# Patient Record
Sex: Female | Born: 1990 | Race: Black or African American | Hispanic: No | Marital: Single | Smoking: Current some day smoker
Health system: Southern US, Community
[De-identification: ages and names within clinical notes are randomized; demographics above are authoritative.]

## PROBLEM LIST (undated history)

## (undated) DIAGNOSIS — E2839 Other primary ovarian failure: Secondary | ICD-10-CM

## (undated) DIAGNOSIS — K219 Gastro-esophageal reflux disease without esophagitis: Secondary | ICD-10-CM

## (undated) HISTORY — DX: Other primary ovarian failure: E28.39

---

## 2011-05-17 ENCOUNTER — Emergency Department (HOSPITAL_COMMUNITY)
Admission: EM | Admit: 2011-05-17 | Discharge: 2011-05-17 | Disposition: A | Discharge status: Home / Self Care | Payer: BC Managed Care – PPO | Attending: Emergency Medicine | Admitting: Emergency Medicine

## 2011-05-17 ENCOUNTER — Emergency Department (HOSPITAL_COMMUNITY): Payer: BC Managed Care – PPO

## 2011-05-17 DIAGNOSIS — M79609 Pain in unspecified limb: Secondary | ICD-10-CM | POA: Insufficient documentation

## 2011-07-10 ENCOUNTER — Encounter: Payer: Self-pay | Admitting: *Deleted

## 2011-07-10 ENCOUNTER — Emergency Department (HOSPITAL_COMMUNITY)
Admission: EM | Admit: 2011-07-10 | Discharge: 2011-07-10 | Discharge status: Against Medical Advice | Payer: BC Managed Care – PPO | Attending: Emergency Medicine | Admitting: Emergency Medicine

## 2011-07-10 DIAGNOSIS — K6289 Other specified diseases of anus and rectum: Secondary | ICD-10-CM | POA: Insufficient documentation

## 2011-07-10 NOTE — ED Notes (Signed)
Called pt x2 , no answer.

## 2011-07-10 NOTE — ED Notes (Signed)
Pt here with rectal pain that started yesterday and no bleeding

## 2012-11-11 ENCOUNTER — Encounter (HOSPITAL_COMMUNITY): Payer: Self-pay | Admitting: Nurse Practitioner

## 2012-11-11 ENCOUNTER — Emergency Department (HOSPITAL_COMMUNITY)
Admission: EM | Admit: 2012-11-11 | Discharge: 2012-11-11 | Disposition: A | Discharge status: Home / Self Care | Payer: BC Managed Care – PPO | Attending: Emergency Medicine | Admitting: Emergency Medicine

## 2012-11-11 ENCOUNTER — Emergency Department (HOSPITAL_COMMUNITY): Payer: BC Managed Care – PPO

## 2012-11-11 DIAGNOSIS — M25549 Pain in joints of unspecified hand: Secondary | ICD-10-CM | POA: Insufficient documentation

## 2012-11-11 DIAGNOSIS — M25542 Pain in joints of left hand: Secondary | ICD-10-CM

## 2012-11-11 DIAGNOSIS — M25449 Effusion, unspecified hand: Secondary | ICD-10-CM | POA: Insufficient documentation

## 2012-11-11 MED ORDER — NAPROXEN 500 MG PO TABS: 500.0000 mg | ORAL_TABLET | Freq: Two times a day (BID) | ORAL | Status: DC

## 2012-11-11 MED ORDER — HYDROCODONE-ACETAMINOPHEN 5-325 MG PO TABS
2.0000 | ORAL_TABLET | Freq: Once | ORAL | Status: AC
  Administered 2012-11-11: 1 via ORAL
  Filled 2012-11-11: qty 1

## 2012-11-11 NOTE — Progress Notes (Signed)
Orthopedic Tech Progress Note Patient Details:  Jody Clark [birth year] [id number]  Ortho Devices Type of Ortho Device: Finger splint Ortho Device/Splint Location: left hand Ortho Device/Splint Interventions: Application   Nikki Dom 11/11/2012, 5:56 PM

## 2012-11-11 NOTE — ED Provider Notes (Signed)
History  This chart was scribed for non-physician practitioner working with Dierdre Forth, PA-C/Brian Alanda Amass, MD, by Candelaria Stagers, ED Scribe. This patient was seen in room TR04C/TR04C and the patient's care was started at 5:19 PM   CSN: [id number]  Arrival date & time 11/11/12  1404   None     Chief Complaint  Patient presents with  . Hand Pain     The history is provided by the patient and medical records. No language interpreter was used.   Jody Clark is a 22 y.o. female who presents to the Emergency Department complaining of left 5th digit pain that started this morning when she woke up.  Pt denies any injury or trauma to the hand.  She denies any new activities.  She has taken nothing for the pain.  Pt denies weakness to the hand.  She has no h/o injury to this hand.  Movement makes the pain worse, rest makes the pain better.  She rates the pain as moderate and nonradiating.  History reviewed. No pertinent past medical history.  History reviewed. No pertinent past surgical history.  History reviewed. No pertinent family history.  History  Substance Use Topics  . Smoking status: Never Smoker   . Smokeless tobacco: Not on file  . Alcohol Use: No    OB History   Grav Para Term Preterm Abortions TAB SAB Ect Mult Living                  Review of Systems  Constitutional: Negative for fever and chills.  Gastrointestinal: Negative for nausea and vomiting.  Musculoskeletal: Positive for arthralgias (pain to left pinky).  All other systems reviewed and are negative.    Allergies  Review of patient's allergies indicates no known allergies.  Home Medications   Current Outpatient Rx  Name  Route  Sig  Dispense  Refill  . naproxen (NAPROSYN) 500 MG tablet   Oral   Take 1 tablet (500 mg total) by mouth 2 (two) times daily with a meal.   30 tablet   0     BP 113/74  Pulse 85  Temp(Src) 98.4 F (36.9 C) (Oral)  Resp 15  SpO2 98%  LMP  10/24/2012  Physical Exam  Nursing note and vitals reviewed. Constitutional: She is oriented to person, place, and time. She appears well-developed and well-nourished. No distress.  HENT:  Head: Normocephalic and atraumatic.  Mouth/Throat: Oropharynx is clear and moist. No oropharyngeal exudate.  Eyes: Conjunctivae are normal. No scleral icterus.  Neck: Normal range of motion. Neck supple.  Cardiovascular: Normal rate, regular rhythm and intact distal pulses.   Pulses:      Radial pulses are 2+ on the right side, and 2+ on the left side.  Claire refill less than 3 seconds  Pulmonary/Chest: Effort normal and breath sounds normal. No respiratory distress. She has no wheezes.  Abdominal: Soft. Bowel sounds are normal. She exhibits no mass. There is no tenderness. There is no rebound and no guarding.  Musculoskeletal:       Left hand: She exhibits decreased range of motion, tenderness, bony tenderness and swelling. She exhibits normal two-point discrimination, normal capillary refill, no deformity and no laceration. Normal sensation noted. Normal strength noted.  Full ROM of the DIP and PIP of left 5th digit.  Mildly decreased grip strength secondary to pain.  Mild swelling of the MCP joint of the left 5th digit.  Full passive ROM.  Cap refill less than 3 seconds.  Neurological: She is alert and oriented to person, place, and time. She exhibits normal muscle tone. Coordination normal. GCS eye subscore is 4. GCS verbal subscore is 5. GCS motor subscore is 6.  Reflex Scores:      Tricep reflexes are 2+ on the right side and 2+ on the left side.      Bicep reflexes are 2+ on the right side and 2+ on the left side.      Brachioradialis reflexes are 2+ on the right side and 2+ on the left side. Speech is clear and goal oriented Moves extremities without ataxia Patient intact including 2 point discrimination  Skin: Skin is warm and dry. No rash noted. She is not diaphoretic. No erythema.   Psychiatric: She has a normal mood and affect.    ED Course  Procedures   DIAGNOSTIC STUDIES: Oxygen Saturation is 98% on room air, normal by my interpretation.    COORDINATION OF CARE:  5:37 PM Discussed course of care with pt which includes splinting the finger, antiinflammatory, and referral to hand specialist if pain does not improve in three days.  Pt understands and agrees.    Labs Reviewed - No data to display Dg Finger Little Left  11/11/2012  *RADIOLOGY REPORT*  Clinical Data: Left little finger pain and unable to move.  LEFT LITTLE FINGER 2+V  Comparison: None  Findings: No evidence of acute fracture, subluxation or dislocation identified.  No radio-opaque foreign bodies are present.  No focal bony lesions are noted.  The joint spaces are unremarkable.  IMPRESSION: No bony abnormality.   Original Report Authenticated By: Harmon Pier, M.D.      1. Pain, joint, hand, left       MDM  Feliberto Gottron resents with nontraumatic left fifth digit MCP pain.  Patient X-Ray negative for obvious fracture or dislocation; no evidence of arthritis in his joints spaces are maintained. Pain managed in ED. Pt advised to follow up with hand specialty orthopedics if symptoms persist for possibility of missed fracture or other joint condition diagnosis.   Finger splinted while in ED, conservative therapy recommended and discussed. Patient will be dc home & is agreeable with above plan. I personally reviewed the imaging tests through PACS system.  I reviewed available ER/hospitalization records through the EMR. I have also discussed reasons to return immediately to the ER.  Patient expresses understanding and agrees with plan.  I personally performed the services described in this documentation, which was scribed in my presence. The recorded information has been reviewed and is accurate.   Dahlia Client Erman Thum, PA-C 11/11/12 1751

## 2012-11-11 NOTE — ED Notes (Signed)
signiture pad not working in room, pt voiced understanding of d/c information

## 2012-11-11 NOTE — ED Notes (Signed)
Pt states she woke this morning, unable to move her left pinky finger.  Denies injury.  No numbess, no pain elsewhere.

## 2012-11-11 NOTE — ED Provider Notes (Signed)
Medical screening examination/treatment/procedure(s) were performed by non-physician practitioner and as supervising physician I was immediately available for consultation/collaboration.    Zoeann Mol D Tangala Wiegert, MD 11/11/12 2342 

## 2012-11-11 NOTE — ED Notes (Signed)
Pt c/o L 5th digit pain for past days, no know injuries, cms intact

## 2013-05-04 ENCOUNTER — Emergency Department (HOSPITAL_COMMUNITY)
Admission: EM | Admit: 2013-05-04 | Discharge: 2013-05-04 | Disposition: A | Discharge status: Home / Self Care | Payer: BC Managed Care – PPO | Attending: Emergency Medicine | Admitting: Emergency Medicine

## 2013-05-04 ENCOUNTER — Encounter (HOSPITAL_COMMUNITY): Payer: Self-pay | Admitting: Emergency Medicine

## 2013-05-04 DIAGNOSIS — K921 Melena: Secondary | ICD-10-CM | POA: Insufficient documentation

## 2013-05-04 DIAGNOSIS — K644 Residual hemorrhoidal skin tags: Secondary | ICD-10-CM | POA: Insufficient documentation

## 2013-05-04 MED ORDER — HYDROCORTISONE 2.5 % RE CREA: TOPICAL_CREAM | RECTAL | Status: DC

## 2013-05-04 NOTE — ED Notes (Signed)
Had a hard bm this am and now she thinks hemorroid  Has dropped hurts now had a spot of blood

## 2013-05-04 NOTE — ED Provider Notes (Signed)
CSN: [id number]     Arrival date & time 05/04/13  1117 History   First MD Initiated Contact with Patient 05/04/13 1245     Chief Complaint  Patient presents with  . Rectal Pain   (Consider location/radiation/quality/duration/timing/severity/associated sxs/prior Treatment) Patient is a 22 y.o. female presenting with hematochezia. The history is provided by the patient. No language interpreter was used.  Rectal Bleeding Associated symptoms: no abdominal pain and no fever   Associated symptoms comment:  She passed a large, hard stool this morning and experienced rectal pain and BRB with stool only. No bleeding since. She has had persistent pain.   No past medical history on file. History reviewed. No pertinent past surgical history. No family history on file. History  Substance Use Topics  . Smoking status: Never Smoker   . Smokeless tobacco: Not on file  . Alcohol Use: No   OB History   Grav Para Term Preterm Abortions TAB SAB Ect Mult Living                 Review of Systems  Constitutional: Negative for fever.  Gastrointestinal: Positive for blood in stool, hematochezia and rectal pain. Negative for abdominal pain.    Allergies  Review of patient's allergies indicates no known allergies.  Home Medications  No current outpatient prescriptions on file. BP 110/60  Pulse 67  Temp(Src) 98.6 F (37 C) (Oral)  Resp 16  Wt 110 lb (49.896 kg)  SpO2 99% Physical Exam  Constitutional: She is oriented to person, place, and time. She appears well-developed and well-nourished. No distress.  Pulmonary/Chest: Effort normal.  Genitourinary:  Large, non-thrombosed external hemorrhoid. No active bleeding.  Neurological: She is alert and oriented to person, place, and time.    ED Course  Procedures (including critical care time) Labs Review Labs Reviewed - No data to display Imaging Review No results found.  MDM  No diagnosis found. 1. External hemorrhoid  Uncomplicated  external hemorrhoid    Arnoldo Hooker, PA-C 05/04/13 1336

## 2013-05-04 NOTE — ED Provider Notes (Signed)
Medical screening examination/treatment/procedure(s) were performed by non-physician practitioner and as supervising physician I was immediately available for consultation/collaboration.   William Adabelle Griffiths, MD 05/04/13 1650 

## 2013-05-14 ENCOUNTER — Other Ambulatory Visit (HOSPITAL_COMMUNITY)
Admission: RE | Admit: 2013-05-14 | Discharge: 2013-05-14 | Disposition: A | Discharge status: Home / Self Care | Payer: BC Managed Care – PPO | Source: Ambulatory Visit | Attending: Family Medicine | Admitting: Family Medicine

## 2013-05-14 ENCOUNTER — Emergency Department (INDEPENDENT_AMBULATORY_CARE_PROVIDER_SITE_OTHER)
Admission: EM | Admit: 2013-05-14 | Discharge: 2013-05-14 | Disposition: A | Discharge status: Home / Self Care | Payer: BC Managed Care – PPO | Source: Home / Self Care | Attending: Family Medicine | Admitting: Family Medicine

## 2013-05-14 ENCOUNTER — Encounter (HOSPITAL_COMMUNITY): Payer: Self-pay | Admitting: Emergency Medicine

## 2013-05-14 DIAGNOSIS — N760 Acute vaginitis: Secondary | ICD-10-CM

## 2013-05-14 DIAGNOSIS — B9689 Other specified bacterial agents as the cause of diseases classified elsewhere: Secondary | ICD-10-CM

## 2013-05-14 DIAGNOSIS — A499 Bacterial infection, unspecified: Secondary | ICD-10-CM

## 2013-05-14 DIAGNOSIS — Z113 Encounter for screening for infections with a predominantly sexual mode of transmission: Secondary | ICD-10-CM | POA: Insufficient documentation

## 2013-05-14 DIAGNOSIS — N390 Urinary tract infection, site not specified: Secondary | ICD-10-CM

## 2013-05-14 LAB — POCT URINALYSIS DIP (DEVICE)
Glucose, UA: NEGATIVE mg/dL
Nitrite: NEGATIVE
Urobilinogen, UA: 0.2 mg/dL (ref 0.0–1.0)

## 2013-05-14 LAB — POCT PREGNANCY, URINE: Preg Test, Ur: NEGATIVE

## 2013-05-14 MED ORDER — METRONIDAZOLE 500 MG PO TABS: 500.0000 mg | ORAL_TABLET | Freq: Two times a day (BID) | ORAL | Status: DC

## 2013-05-14 MED ORDER — CEPHALEXIN 500 MG PO CAPS: 1000.0000 mg | ORAL_CAPSULE | Freq: Two times a day (BID) | ORAL | Status: DC

## 2013-05-14 NOTE — ED Provider Notes (Signed)
CSN: [id number]     Arrival date & time 05/14/13  1646 History   First MD Initiated Contact with Patient 05/14/13 1725     Chief Complaint  Patient presents with  . Vaginal Itching    x 3 days.    (Consider location/radiation/quality/duration/timing/severity/associated sxs/prior Treatment) HPI Comments: 22 year old female presents complaining of possible yeast infection, also she has never had one before. She has vaginal irritation, cloudy urine, and a slight discharge. This has been going on for about 3 days. She denies fever, chills, abdominal pain, flank pain. She denies possibility of STD, stating she is not sexually active.  Patient is a 22 y.o. female presenting with vaginal itching.  Vaginal Itching Pertinent negatives include no chest pain, no abdominal pain and no shortness of breath.    History reviewed. No pertinent past medical history. History reviewed. No pertinent past surgical history. History reviewed. No pertinent family history. History  Substance Use Topics  . Smoking status: Never Smoker   . Smokeless tobacco: Not on file  . Alcohol Use: Yes   OB History   Grav Para Term Preterm Abortions TAB SAB Ect Mult Living                 Review of Systems  Constitutional: Negative for fever and chills.  Eyes: Negative for visual disturbance.  Respiratory: Negative for cough and shortness of breath.   Cardiovascular: Negative for chest pain, palpitations and leg swelling.  Gastrointestinal: Negative for nausea, vomiting and abdominal pain.  Endocrine: Negative for polydipsia and polyuria.  Genitourinary: Positive for dysuria and vaginal discharge. Negative for urgency, frequency, hematuria, flank pain, vaginal bleeding, genital sores and vaginal pain.  Musculoskeletal: Negative for myalgias and arthralgias.  Skin: Negative for rash.  Neurological: Negative for dizziness, weakness and light-headedness.    Allergies  Review of patient's allergies indicates no known  allergies.  Home Medications   Current Outpatient Rx  Name  Route  Sig  Dispense  Refill  . cephALEXin (KEFLEX) 500 MG capsule   Oral   Take 2 capsules (1,000 mg total) by mouth 2 (two) times daily.   28 capsule   0   . hydrocortisone (ANUSOL-HC) 2.5 % rectal cream      Apply rectally 2 times daily   30 g   0   . metroNIDAZOLE (FLAGYL) 500 MG tablet   Oral   Take 1 tablet (500 mg total) by mouth 2 (two) times daily.   14 tablet   0    BP 124/84  Pulse 84  Temp(Src) 98.6 F (37 C) (Oral)  Resp 20  SpO2 100%  LMP 04/23/2013 Physical Exam  Nursing note and vitals reviewed. Constitutional: She is oriented to person, place, and time. Vital signs are normal. She appears well-developed and well-nourished. No distress.  HENT:  Head: Normocephalic and atraumatic.  Pulmonary/Chest: Effort normal. No respiratory distress.  Genitourinary: Vaginal discharge (Thin, yellow, malodorous) found.  Neurological: She is alert and oriented to person, place, and time. She has normal strength. Coordination normal.  Skin: Skin is warm and dry. No rash noted. She is not diaphoretic.  Psychiatric: She has a normal mood and affect. Judgment normal.    ED Course  Procedures (including critical care time) Labs Review Labs Reviewed  POCT URINALYSIS DIP (DEVICE) - Abnormal; Notable for the following:    Hgb urine dipstick MODERATE (*)    Protein, ur 30 (*)    Leukocytes, UA LARGE (*)    All other components within  normal limits  URINE CULTURE  POCT PREGNANCY, URINE  CERVICOVAGINAL ANCILLARY ONLY   Imaging Review No results found.  MDM   1. UTI (lower urinary tract infection)   2. BV (bacterial vaginosis)    Treating for both UTI and for bacterial vaginosis. Vaginal swabs sent. Followup if not improving.   Meds ordered this encounter  Medications  . metroNIDAZOLE (FLAGYL) 500 MG tablet    Sig: Take 1 tablet (500 mg total) by mouth 2 (two) times daily.    Dispense:  14 tablet     Refill:  0  . cephALEXin (KEFLEX) 500 MG capsule    Sig: Take 2 capsules (1,000 mg total) by mouth 2 (two) times daily.    Dispense:  28 capsule    Refill:  0       Graylon Good, PA-C 05/14/13 1823

## 2013-05-14 NOTE — ED Notes (Signed)
C/o vaginal irritation with white discharge and mild odor x 3 days. Pt has not tried any otc meds for treatment.  Denies pelvic/abdominal pain or urinary symptoms.

## 2013-05-14 NOTE — ED Provider Notes (Signed)
Medical screening examination/treatment/procedure(s) were performed by resident physician or non-physician practitioner and as supervising physician I was immediately available for consultation/collaboration.   KINDL,JAMES DOUGLAS MD.   James D Kindl, MD 05/14/13 1917 

## 2013-05-16 LAB — URINE CULTURE

## 2013-05-16 NOTE — ED Notes (Signed)
GC/Chlamydia neg., Affirm: Candida, Trich and Gardnerella all neg.  Urine culture:60,000 colonies Group B Strep (s. Agalactiae).  Pt. adequately treated with Keflex. Vassie Moselle 05/16/2013

## 2015-06-11 ENCOUNTER — Emergency Department (HOSPITAL_COMMUNITY): Payer: BLUE CROSS/BLUE SHIELD

## 2015-06-11 ENCOUNTER — Encounter (HOSPITAL_COMMUNITY): Payer: Self-pay | Admitting: Emergency Medicine

## 2015-06-11 ENCOUNTER — Emergency Department (HOSPITAL_COMMUNITY)
Admission: EM | Admit: 2015-06-11 | Discharge: 2015-06-12 | Disposition: A | Discharge status: Home / Self Care | Payer: BLUE CROSS/BLUE SHIELD | Attending: Emergency Medicine | Admitting: Emergency Medicine

## 2015-06-11 DIAGNOSIS — K219 Gastro-esophageal reflux disease without esophagitis: Secondary | ICD-10-CM | POA: Diagnosis not present

## 2015-06-11 DIAGNOSIS — Z792 Long term (current) use of antibiotics: Secondary | ICD-10-CM | POA: Insufficient documentation

## 2015-06-11 DIAGNOSIS — R1013 Epigastric pain: Secondary | ICD-10-CM

## 2015-06-11 DIAGNOSIS — R079 Chest pain, unspecified: Secondary | ICD-10-CM

## 2015-06-11 HISTORY — DX: Gastro-esophageal reflux disease without esophagitis: K21.9

## 2015-06-11 LAB — CBC
HCT: 41.1 % (ref 36.0–46.0)
HEMOGLOBIN: 14.6 g/dL (ref 12.0–15.0)
MCH: 30 pg (ref 26.0–34.0)
MCHC: 35.5 g/dL (ref 30.0–36.0)
MCV: 84.6 fL (ref 78.0–100.0)
PLATELETS: 290 10*3/uL (ref 150–400)
RBC: 4.86 MIL/uL (ref 3.87–5.11)
RDW: 12.3 % (ref 11.5–15.5)
WBC: 5.7 10*3/uL (ref 4.0–10.5)

## 2015-06-11 LAB — BASIC METABOLIC PANEL
ANION GAP: 8 (ref 5–15)
BUN: 5 mg/dL — ABNORMAL LOW (ref 6–20)
CHLORIDE: 99 mmol/L — AB (ref 101–111)
CO2: 28 mmol/L (ref 22–32)
CREATININE: 0.92 mg/dL (ref 0.44–1.00)
Calcium: 9.5 mg/dL (ref 8.9–10.3)
GFR calc non Af Amer: >60 mL/min (ref >60)
Glucose, Bld: 100 mg/dL — ABNORMAL HIGH (ref 65–99)
POTASSIUM: 4 mmol/L (ref 3.5–5.1)
SODIUM: 135 mmol/L (ref 135–145)

## 2015-06-11 LAB — I-STAT TROPONIN, ED: Troponin i, poc: 0 ng/mL (ref 0.00–0.08)

## 2015-06-11 MED ORDER — OMEPRAZOLE 20 MG PO CPDR: 20.0000 mg | DELAYED_RELEASE_CAPSULE | Freq: Every day | ORAL | Status: DC

## 2015-06-11 MED ORDER — GI COCKTAIL ~~LOC~~
30.0000 mL | Freq: Once | ORAL | Status: AC
  Administered 2015-06-11: 30 mL via ORAL
  Filled 2015-06-11: qty 30

## 2015-06-11 MED ORDER — PANTOPRAZOLE SODIUM 40 MG PO TBEC
40.0000 mg | DELAYED_RELEASE_TABLET | Freq: Every day | ORAL | Status: DC
  Administered 2015-06-11: 40 mg via ORAL
  Filled 2015-06-11: qty 1

## 2015-06-11 NOTE — ED Notes (Signed)
PA at bedside.

## 2015-06-11 NOTE — ED Provider Notes (Signed)
CSN: [id number]     Arrival date & time 06/11/15  1958 History   First MD Initiated Contact with Patient 06/11/15 2231     Chief Complaint  Patient presents with  . Chest Pain     (Consider location/radiation/quality/duration/timing/severity/associated sxs/prior Treatment) The history is provided by the patient and medical records. No language interpreter was used.    Jody Gottronbony Code is a 24 y.o. female  with a hx of GERD presents to the Emergency Department complaining of gradual, persistent, progressively worsening epigastric abd pain with a feeling of pain and fullness in her central chest onset last night when laying down in bed.  She denies a feeling of acid wash.  Pt reports eating broccoli and cheese rice along with chips around 10pm and then went to bed around 11:30PM.  Pt reports the discomfort has persisted throughout today.  Pt denies taking any medications for her reflux.  She denies fever, chills, headache, neck pain, abd pain, N/V/D, weakness, dizziness, syncope, dysuria, hematuria, hematochezia, melena, hematemesis. No known aggravating or alleviating factors.  No treatment PTA.  Pt denies personal or family cardiac history.    Past Medical History  Diagnosis Date  . Acid reflux    History reviewed. No pertinent past surgical history. No family history on file. Social History  Substance Use Topics  . Smoking status: Never Smoker   . Smokeless tobacco: None  . Alcohol Use: Yes   OB History    No data available     Review of Systems  Constitutional: Negative for fever, diaphoresis, appetite change, fatigue and unexpected weight change.  HENT: Negative for mouth sores.   Eyes: Negative for visual disturbance.  Respiratory: Positive for chest tightness (central discomfort). Negative for cough, shortness of breath and wheezing.   Cardiovascular: Negative for chest pain.  Gastrointestinal: Positive for abdominal pain (epigastric  ). Negative for nausea, vomiting,  diarrhea and constipation.  Endocrine: Negative for polydipsia, polyphagia and polyuria.  Genitourinary: Negative for dysuria, urgency, frequency and hematuria.  Musculoskeletal: Negative for back pain and neck stiffness.  Skin: Negative for rash.  Allergic/Immunologic: Negative for immunocompromised state.  Neurological: Negative for syncope, light-headedness and headaches.  Hematological: Does not bruise/bleed easily.  Psychiatric/Behavioral: Negative for sleep disturbance. The patient is not nervous/anxious.       Allergies  Review of patient's allergies indicates no known allergies.  Home Medications   Prior to Admission medications   Medication Sig Start Date End Date Taking? Authorizing Provider  cephALEXin (KEFLEX) 500 MG capsule Take 2 capsules (1,000 mg total) by mouth 2 (two) times daily. 05/14/13   Graylon GoodZachary H Baker, PA-C  omeprazole (PRILOSEC) 20 MG capsule Take 1 capsule (20 mg total) by mouth daily. 06/11/15   Kiah Vanalstine, PA-C   BP 129/93 mmHg  Pulse 69  Temp(Src) 98 F (36.7 C) (Oral)  Resp 17  SpO2 100%  LMP 05/21/2015 Physical Exam  Constitutional: She appears well-developed and well-nourished. No distress.  Awake, alert, nontoxic appearance  HENT:  Head: Normocephalic and atraumatic.  Mouth/Throat: Oropharynx is clear and moist. No oropharyngeal exudate.  Eyes: Conjunctivae are normal. No scleral icterus.  Neck: Normal range of motion. Neck supple.  Cardiovascular: Normal rate, regular rhythm, normal heart sounds and intact distal pulses.   No murmur heard. Pulmonary/Chest: Effort normal and breath sounds normal. No respiratory distress. She has no wheezes.  Equal chest expansion  Abdominal: Soft. Bowel sounds are normal. She exhibits no mass. There is no tenderness. There is no rebound and  no guarding.  Musculoskeletal: Normal range of motion. She exhibits no edema.  Neurological: She is alert.  Speech is clear and goal oriented Moves extremities  without ataxia  Skin: Skin is warm and dry. She is not diaphoretic.  Psychiatric: She has a normal mood and affect.  Nursing note and vitals reviewed.   ED Course  Procedures (including critical care time) Labs Review Labs Reviewed  BASIC METABOLIC PANEL - Abnormal; Notable for the following:    Chloride 99 (*)    Glucose, Bld 100 (*)    BUN 5 (*)    All other components within normal limits  CBC  I-STAT TROPOININ, ED    Imaging Review Dg Chest 2 View  06/11/2015  CLINICAL DATA:  Chest pain for 1 day EXAM: CHEST  2 VIEW COMPARISON:  None. FINDINGS: The lungs are clear. The heart size and pulmonary vascularity are normal. No adenopathy. No pneumothorax. No bone lesions. IMPRESSION: No edema or consolidation. Electronically Signed   By: Bretta Bang III M.D.   On: 06/11/2015 20:45   I have personally reviewed and evaluated these images and lab results as part of my medical decision-making.   EKG Interpretation   Date/Time:  Wednesday June 11 2015 20:07:05 EDT Ventricular Rate:  77 PR Interval:  110 QRS Duration: 72 QT Interval:  376 QTC Calculation: 425 R Axis:   61 Text Interpretation:  Sinus rhythm with short PR Otherwise normal ECG  Confirmed by ALLEN  MD, ANTHONY ([id number]) on 06/11/2015 10:48:37 PM      MDM   Final diagnoses:  Epigastric abdominal pain  Chest pain, unspecified chest pain type  Gastroesophageal reflux disease, esophagitis presence not specified   Jody Clark presents with reflux symptoms.  Patient is to be discharged with recommendation to follow up with PCP in regards to today's hospital visit. Chest pain is not likely of cardiac or pulmonary etiology d/t presentation, PERC negative, VSS, no tracheal deviation, no JVD or new murmur, RRR, breath sounds equal bilaterally, EKG without acute abnormalities, negative troponin, and negative CXR. Pt has been advised to return to the ED if CP becomes exertional, associated with diaphoresis or  nausea, radiates to left jaw/arm, worsens or becomes concerning in any way. Pt appears reliable for follow up and is agreeable to discharge.   BP 129/93 mmHg  Pulse 69  Temp(Src) 98 F (36.7 C) (Oral)  Resp 17  SpO2 100%  LMP 05/21/2015    Dierdre Forth, PA-C 06/11/15 2317  Lorre Nick, MD 06/11/15 2318

## 2015-06-11 NOTE — ED Notes (Signed)
Reviewed discharge instructions and prescription medication with patient. Patient verbalized understanding and declined wheelchair.

## 2015-06-11 NOTE — ED Notes (Signed)
Pt c/o epigastric pain since last night.  States she has history of acid reflux.  Unable to sleep last night due to pain.  Denies sob, nausea, and vomiting.

## 2015-06-11 NOTE — Discharge Instructions (Signed)
1. Medications: omeprazole, usual home medications 2. Treatment: rest, drink plenty of fluids,  3. Follow Up: Please followup with your primary doctor in 2 days for discussion of your diagnoses and further evaluation after today's visit; if you do not have a primary care doctor use the resource guide provided to find one; Please return to the ER for worsening symptoms or other concerns    Gastroesophageal Reflux Disease, Adult Normally, food travels down the esophagus and stays in the stomach to be digested. However, when a person has gastroesophageal reflux disease (GERD), food and stomach acid move back up into the esophagus. When this happens, the esophagus becomes sore and inflamed. Over time, GERD can create small holes (ulcers) in the lining of the esophagus.  CAUSES This condition is caused by a problem with the muscle between the esophagus and the stomach (lower esophageal sphincter, or LES). Normally, the LES muscle closes after food passes through the esophagus to the stomach. When the LES is weakened or abnormal, it does not close properly, and that allows food and stomach acid to go back up into the esophagus. The LES can be weakened by certain dietary substances, medicines, and medical conditions, including:  Tobacco use.  Pregnancy.  Having a hiatal hernia.  Heavy alcohol use.  Certain foods and beverages, such as coffee, chocolate, onions, and peppermint. RISK FACTORS This condition is more likely to develop in:  People who have an increased body weight.  People who have connective tissue disorders.  People who use NSAID medicines. SYMPTOMS Symptoms of this condition include:  Heartburn.  Difficult or painful swallowing.  The feeling of having a lump in the throat.  Abitter taste in the mouth.  Bad breath.  Having a large amount of saliva.  Having an upset or bloated stomach.  Belching.  Chest pain.  Shortness of breath or wheezing.  Ongoing  (chronic) cough or a night-time cough.  Wearing away of tooth enamel.  Weight loss. Different conditions can cause chest pain. Make sure to see your health care provider if you experience chest pain. DIAGNOSIS Your health care provider will take a medical history and perform a physical exam. To determine if you have mild or severe GERD, your health care provider may also monitor how you respond to treatment. You may also have other tests, including:  An endoscopy toexamine your stomach and esophagus with a small camera.  A test thatmeasures the acidity level in your esophagus.  A test thatmeasures how much pressure is on your esophagus.  A barium swallow or modified barium swallow to show the shape, size, and functioning of your esophagus. TREATMENT The goal of treatment is to help relieve your symptoms and to prevent complications. Treatment for this condition may vary depending on how severe your symptoms are. Your health care provider may recommend:  Changes to your diet.  Medicine.  Surgery. HOME CARE INSTRUCTIONS Diet  Follow a diet as recommended by your health care provider. This may involve avoiding foods and drinks such as:  Coffee and tea (with or without caffeine).  Drinks that containalcohol.  Energy drinks and sports drinks.  Carbonated drinks or sodas.  Chocolate and cocoa.  Peppermint and mint flavorings.  Garlic and onions.  Horseradish.  Spicy and acidic foods, including peppers, chili powder, curry powder, vinegar, hot sauces, and barbecue sauce.  Citrus fruit juices and citrus fruits, such as oranges, lemons, and limes.  Tomato-based foods, such as red sauce, chili, salsa, and pizza with red sauce.  Fried and fatty foods, such as donuts, french fries, potato chips, and high-fat dressings.  High-fat meats, such as hot dogs and fatty cuts of red and white meats, such as rib eye steak, sausage, ham, and bacon.  High-fat dairy items, such as  whole milk, butter, and cream cheese.  Eat small, frequent meals instead of large meals.  Avoid drinking large amounts of liquid with your meals.  Avoid eating meals during the 2-3 hours before bedtime.  Avoid lying down right after you eat.  Do not exercise right after you eat. General Instructions  Pay attention to any changes in your symptoms.  Take over-the-counter and prescription medicines only as told by your health care provider. Do not take aspirin, ibuprofen, or other NSAIDs unless your health care provider told you to do so.  Do not use any tobacco products, including cigarettes, chewing tobacco, and e-cigarettes. If you need help quitting, ask your health care provider.  Wear loose-fitting clothing. Do not wear anything tight around your waist that causes pressure on your abdomen.  Raise (elevate) the head of your bed 6 inches (15cm).  Try to reduce your stress, such as with yoga or meditation. If you need help reducing stress, ask your health care provider.  If you are overweight, reduce your weight to an amount that is healthy for you. Ask your health care provider for guidance about a safe weight loss goal.  Keep all follow-up visits as told by your health care provider. This is important. SEEK MEDICAL CARE IF:  You have new symptoms.  You have unexplained weight loss.  You have difficulty swallowing, or it hurts to swallow.  You have wheezing or a persistent cough.  Your symptoms do not improve with treatment.  You have a hoarse voice. SEEK IMMEDIATE MEDICAL CARE IF:  You have pain in your arms, neck, jaw, teeth, or back.  You feel sweaty, dizzy, or light-headed.  You have chest pain or shortness of breath.  You vomit and your vomit looks like blood or coffee grounds.  You faint.  Your stool is bloody or black.  You cannot swallow, drink, or eat.   This information is not intended to replace advice given to you by your health care provider.  Make sure you discuss any questions you have with your health care provider.   Document Released: 05/26/2005 Document Revised: 05/07/2015 Document Reviewed: 12/11/2014 Elsevier Interactive Patient Education 2016 [employer].    Emergency Department Resource Guide 1) Find a Doctor and Pay Out of Pocket Although you won't have to find out who is covered by your insurance plan, it is a good idea to ask around and get recommendations. You will then need to call the office and see if the doctor you have chosen will accept you as a new patient and what types of options they offer for patients who are self-pay. Some doctors offer discounts or will set up payment plans for their patients who do not have insurance, but you will need to ask so you aren't surprised when you get to your appointment.  2) Contact Your Local Health Department Not all health departments have doctors that can see patients for sick visits, but many do, so it is worth a call to see if yours does. If you don't know where your local health department is, you can check in your phone book. The CDC also has a tool to help you locate your state's health department, and many state websites also have listings of  all of their local health departments.  3) Find a Walk-in Clinic If your illness is not likely to be very severe or complicated, you may want to try a walk in clinic. These are popping up all over the country in pharmacies, drugstores, and shopping centers. They're usually staffed by nurse practitioners or physician assistants that have been trained to treat common illnesses and complaints. They're usually fairly quick and inexpensive. However, if you have serious medical issues or chronic medical problems, these are probably not your best option.  No Primary Care Doctor: - Call Health Connect at  [phone] - they can help you locate a primary care doctor that  accepts your insurance, provides certain services, etc. - Physician  Referral Service- [phone]  Chronic Pain Problems: Organization         Address  Phone   Notes  [facility]  [phone] Patients need to be referred by their primary care doctor.   Medication Assistance: Organization         Address  Phone   Notes  [facility] Medication [facility] [address] [place] [place], [place] [address] [phone] --Must be a resident of [facility] -- Must have NO insurance coverage whatsoever (no Medicaid/ Medicare, etc.) -- The pt. MUST have a primary care doctor that directs their care regularly and follows them in the community   [facility]  [phone]   [employer]  [phone]    Agencies that provide inexpensive medical care: Organization         Address  Phone   Notes  [facility]  [phone]   [facility]    [phone]   [facility] [address] [place], [place] [address] [phone]   [facility] [address], [place] [phone]   Planned Parenthood    [phone]   [facility]    [phone]   [facility]  [address], [place] Phone:  [phone], Fax:  [phone] Hours of Operation:  9 am - 6 pm, M-F.  Also accepts Medicaid/Medicare and self-pay.  [facility]  [address], [place] Phone: [phone], Fax: [phone]. Hours of Operation:  8:30 am - 5:30 pm, M-F.  Also accepts Medicaid and self-pay.  [facility] [place] [address], [place] Phone: [phone]   [facility] [address] Natasha Bence [place], [place] [phone], Ext. [phone] Mondays & Thursdays: 7-9 AM.  First 15 patients are seen on a first come, first serve basis.    Medicaid-accepting [facility] Providers:  Organization         Address  Phone   Notes  [facility] [address], [place] [phone] Also accepts self-pay patients.  [facility] [address] Laurell Josephs [employer], [place]  [phone]   [facility] [address], [place] [phone]   [facility] [address], [place] [phone]   Renaye Rakers [address], [place]   [phone] Only accepts [place] Access [place] patients after they have their name applied to their card.   Self-Pay (no insurance) in [facility]:  Retail buyer   Notes  Sickle Cell Patients, [facility] [address], [place] [phone]   [facility] [address], [place] [phone]   [facility] [place]  [address], [place] [phone]   [facility]/Dr. Osei-Bonsu  [address] [place] [address], [place] or [phone], [place] [phone] Phone number for both [place] and [place] locations is the same.  [facility] [address], [place] [phone]   [facility] [address] [place] [address], [place] or [address] [phone]   [facility] [address], [place] [phone], phone; [phone], fax Sees patients 1st and 3rd Saturday of every month.  Must not qualify for public or private insurance (i.e. Medicaid, Medicare, [place] Health Choice, Veterans' Benefits)  Household income should be no more than 200% of the poverty level The clinic cannot treat you if you are pregnant or think you are pregnant  Sexually transmitted diseases are not treated at the clinic.    Dental Care: Organization         Address  Phone  Notes  [facility] [address], [place] [phone] Accepts children up to age 23 who are enrolled  in [place] or [place] Health Choice; pregnant women with a Medicaid card; and children who have applied for Medicaid or [place] Health Choice, but were declined, whose parents can pay a reduced fee at time of service.  [facility] [place]  [address], [place] [phone] Accepts children up to age 23 who are enrolled in [place] or [place] Health Choice; pregnant women with a Medicaid card; and children who have applied for Medicaid or [place] Health Choice, but were declined, whose parents can pay a reduced fee at time of service.  [facility]  [address], [place] [phone] Patients are seen by appointment only. Walk-ins are not accepted. [facility] will see patients 23 years of age and older. Monday - Tuesday (8am-5pm) Most Wednesdays (8:30-5pm) $30 per visit, cash only  [facility]  [address], [facility] [phone] Patients are seen by appointment only. Walk-ins are not accepted. [facility] will see patients 23 years of age and older. One Wednesday Evening (Monthly: Volunteer Based).  $30 per visit, cash only  [employer] [facility]  [phone] for adults; Children under age 374, call Graduate Pediatric Dentistry at [phone]. Children aged 684-14, please call [phone] to request a pediatric application.  Dental services are provided in all areas of dental care including fillings, crowns and bridges, complete and partial dentures, implants, gum treatment, root canals, and extractions. Preventive care is also provided. Treatment is provided to both adults and children. Patients are selected via a lottery and there is often a waiting list.   [facility] [address], [place]  [phone] [email or web address]   [facility] [address], [place], [place] [phone], Ext. [phone] Second and Fourth Thursday of each month, opens at  6:30 AM; Clinic ends at 9 AM.  Patients are seen on a first-come first-served basis, and a limited number are seen during each clinic.   [facility]  [address], [place], [place] [phone]   Eligibility  Requirements You must have lived in [place], [place], or [place] counties for at least the last three months.   You cannot be eligible for state or federal sponsored [place], including [employer], [place], or [employer].   You generally cannot be eligible for healthcare insurance through your employer.    How to apply: Eligibility screenings are held every Tuesday and Wednesday afternoon from 1:00 pm until 4:00 pm. You do not need an appointment for the interview!  [facility] [address] [place], [place] [phone]   [facility] Health Department  [phone]   [facility] Health Department  [phone]   [facility] Health Department  [phone]    Behavioral Health Resources in the Community: Intensive Outpatient Programs Organization         Address  Phone  Notes  [facility] [address] [place], [place] [phone]   [facility] [address] [place], [place] [phone]   [facility] [address] [place], [place]  [phone]   [facility] [address]  [place], [place] [phone] or [phone]   Substance Abuse Resources Organization         Address  Phone  Notes  [facility] Drug Services  [phone]   [facility]  [phone]   [facility] [place]  [phone]   Floydene Flock  [phone]   Residential & Outpatient Substance Abuse Program  [phone]   Psychological Services Organization         Address  Phone  Notes  [facility]  [phone]   [facility]  [phone]   [facility] [address] [place]  [phone] or [phone]    Mobile Crisis Teams Organization         Address  Phone  Notes  [facility], Mobile Crisis Care Unit  [phone]   [facility]  [address] [place], [place] [phone]   Doristine Locks [address] [place] [phone]    Self-Help/Support Groups Organization         Address  Phone             Notes  [facility] [place] - variety of support groups  [phone]- I7437963 Call for more information  Narcotics Anonymous (NA), [facility]aring [facility] [address], [employer] [place]  2 meetings at this location   Statistician         Address  Phone  Notes  [facility] [address],    [place]  [phone]   [facility]  [address] [place] [address], [place], [place] [phone]   [facility] [address] [place], [place] [phone] Admissions: 8am-3pm M-F  [facility] [address]    [place], [place] [phone]   [facility] [address] Starling Manns [place], [place] [phone]   [facility] [address]  [place], [place] [phone]   [facility] - Intensive Outpatient [address] Laurell Josephs [address], [place], [place] [phone]   [facility] ([facility] [address]  [place], [place] [phone] or [phone]   [facility] ([facility]) [address] [place], [place] [phone] Accepts Medicaid  [facility] [address] [place] [address]  [place] [phone] Substance Abuse/Addiction Treatment   [facility] Resources Organization  Address  Phone  Notes  [facility]  7011290418   Angie Fava, PhD [address] Ervin Knack [place], [place]   [phone] or 2564730957   [facility]   [address] [place], [place] [phone]   [facility]  [address], [place], [place] [phone] Insurance/Medicaid/sponsorship through [facility] and Families [address]                                    [place], [place] [phone] Therapy/tele-psych/case  [facility] [address] [address], [place] [phone]    Dr. Lolly Mustache  [phone]   [facility] [place]  [employer] [facility] Dept. 1) [address] [place] 2) [address] [place] 3)  [address], [place] [phone]  [phone]   [facility] Child Abuse Hotline 4580493682 or [phone] (After Hours)

## 2015-11-13 ENCOUNTER — Emergency Department (HOSPITAL_COMMUNITY)
Admission: EM | Admit: 2015-11-13 | Discharge: 2015-11-13 | Disposition: A | Discharge status: Home / Self Care | Payer: BLUE CROSS/BLUE SHIELD | Attending: Emergency Medicine | Admitting: Emergency Medicine

## 2015-11-13 ENCOUNTER — Encounter (HOSPITAL_COMMUNITY): Payer: Self-pay | Admitting: Nurse Practitioner

## 2015-11-13 DIAGNOSIS — K219 Gastro-esophageal reflux disease without esophagitis: Secondary | ICD-10-CM | POA: Diagnosis not present

## 2015-11-13 DIAGNOSIS — R05 Cough: Secondary | ICD-10-CM | POA: Diagnosis present

## 2015-11-13 DIAGNOSIS — Z792 Long term (current) use of antibiotics: Secondary | ICD-10-CM | POA: Diagnosis not present

## 2015-11-13 DIAGNOSIS — J069 Acute upper respiratory infection, unspecified: Secondary | ICD-10-CM | POA: Diagnosis not present

## 2015-11-13 DIAGNOSIS — H9203 Otalgia, bilateral: Secondary | ICD-10-CM | POA: Insufficient documentation

## 2015-11-13 DIAGNOSIS — Z79899 Other long term (current) drug therapy: Secondary | ICD-10-CM | POA: Insufficient documentation

## 2015-11-13 NOTE — ED Notes (Signed)
Pt c/o 4 day history cough, sneezing, sore throat, body aches, bilateral ear pain. she denies fevers. She tried nyquil and alka seltzer for cold symptoms at home with no relief

## 2015-11-13 NOTE — ED Notes (Signed)
Patient able to ambulte independently 

## 2015-11-13 NOTE — ED Provider Notes (Signed)
CSN: [id number]     Arrival date & time 11/13/15  1450 History  By signing my name below, I, [name], attest that this documentation has been prepared under the direction and in the presence of [employer], PA-C.  Electronically Signed: [name], ED Scribe. 11/13/2015. 4:34 PM.   Chief Complaint  Patient presents with  . URI   The history is provided by the patient. No language interpreter was used.     HPI Comments: Jody Clark is a 25 y.o. female who presents to the Emergency Department complaining of gradual onset, constant, URI like symptoms including dry cough x 5 days. Pt also complains of sneezing, generalized weakness, bilateral ear pain, body aches, headache, and sore throat. Pt has been taking Nyquil and Alka seltzer without relief. The last time she took any medication was last night. No recent sick contact with similar symptoms. She did not receive the flu vaccine this year. Denies nausea, vomiting, diarrhea, shortness of breath, chest pain, fever, or any other associated symptoms.   Past Medical History  Diagnosis Date  . Acid reflux    History reviewed. No pertinent past surgical history. History reviewed. No pertinent family history. Social History  Substance Use Topics  . Smoking status: Never Smoker   . Smokeless tobacco: None  . Alcohol Use: Yes   OB History    No data available     Review of Systems  A complete 10 system review of systems was obtained and all systems are negative except as noted in the HPI and PMH.   Allergies  Review of patient's allergies indicates no known allergies.  Home Medications   Prior to Admission medications   Medication Sig Start Date End Date Taking? Authorizing Provider  cephALEXin (KEFLEX) 500 MG capsule Take 2 capsules (1,000 mg total) by mouth 2 (two) times daily. 05/14/13   Graylon Good, PA-C  omeprazole (PRILOSEC) 20 MG capsule Take 1 capsule (20 mg total) by mouth daily. 06/11/15   Hannah  Muthersbaugh, PA-C   BP 128/93 mmHg  Pulse 96  Temp(Src) 99.2 F (37.3 C) (Oral)  Resp 18  SpO2 98%   Physical Exam  Constitutional: She is oriented to person, place, and time. She appears well-developed and well-nourished. No distress.  HENT:  Head: Normocephalic and atraumatic.  Right Ear: Tympanic membrane and ear canal normal.  Left Ear: Tympanic membrane and ear canal normal.  Mouth/Throat: Uvula is midline and oropharynx is clear and moist.  Eyes: Conjunctivae and EOM are normal.  Neck: Neck supple. No tracheal deviation present.  Normal ROM of neck.  No nuchal rigidity.   Cardiovascular: Normal rate, regular rhythm and normal heart sounds.   Pulmonary/Chest: Effort normal and breath sounds normal. No respiratory distress. She has no wheezes. She has no rhonchi. She has no rales.  Musculoskeletal: Normal range of motion.  Neurological: She is alert and oriented to person, place, and time.  Skin: Skin is warm and dry.  Psychiatric: She has a normal mood and affect. Her behavior is normal.  Nursing note and vitals reviewed.   ED Course  Procedures (including critical care time)  DIAGNOSTIC STUDIES: Oxygen Saturation is 98% on RA, normal by my interpretation.    COORDINATION OF CARE: 4:31 PM-Discussed treatment plan which includes OTC cold medication with pt at bedside and pt agreed to plan.   Labs Review Labs Reviewed - No data to display  Imaging Review No results found.   EKG Interpretation None  MDM   Final diagnoses:  None   Pt symptoms consistent with URI. Pt will be discharged with symptomatic treatment.  Discussed return precautions.  Pt is hemodynamically stable & in NAD prior to discharge.  I personally performed the services described in this documentation, which was scribed in my presence. The recorded information has been reviewed and is accurate.     Santiago GladHeather Karlea Mckibbin, PA-C 11/13/15 1732  Bethann BerkshireJoseph Zammit, MD 11/15/15 1538

## 2015-11-13 NOTE — Discharge Instructions (Signed)
Upper Respiratory Infection, Adult Most upper respiratory infections (URIs) are a viral infection of the air passages leading to the lungs. A URI affects the nose, throat, and upper air passages. The most common type of URI is nasopharyngitis and is typically referred to as "the common cold." URIs run their course and usually go away on their own. Most of the time, a URI does not require medical attention, but sometimes a bacterial infection in the upper airways can follow a viral infection. This is called a secondary infection. Sinus and middle ear infections are common types of secondary upper respiratory infections. Bacterial pneumonia can also complicate a URI. A URI can worsen asthma and chronic obstructive pulmonary disease (COPD). Sometimes, these complications can require emergency medical care and may be life threatening.  CAUSES Almost all URIs are caused by viruses. A virus is a type of germ and can spread from one person to another.  RISKS FACTORS You may be at risk for a URI if:   You smoke.   You have chronic heart or lung disease.  You have a weakened defense (immune) system.   You are very young or very old.   You have nasal allergies or asthma.  You work in crowded or poorly ventilated areas.  You work in health care facilities or schools. SIGNS AND SYMPTOMS  Symptoms typically develop 2-3 days after you come in contact with a cold virus. Most viral URIs last 7-10 days. However, viral URIs from the influenza virus (flu virus) can last 14-18 days and are typically more severe. Symptoms may include:   Runny or stuffy (congested) nose.   Sneezing.   Cough.   Sore throat.   Headache.   Fatigue.   Fever.   Loss of appetite.   Pain in your forehead, behind your eyes, and over your cheekbones (sinus pain).  Muscle aches.  DIAGNOSIS  Your health care provider may diagnose a URI by:  Physical exam.  Tests to check that your symptoms are not due to  another condition such as:  Strep throat.  Sinusitis.  Pneumonia.  Asthma. TREATMENT  A URI goes away on its own with time. It cannot be cured with medicines, but medicines may be prescribed or recommended to relieve symptoms. Medicines may help:  Reduce your fever.  Reduce your cough.  Relieve nasal congestion. HOME CARE INSTRUCTIONS   Take medicines only as directed by your health care provider.   Gargle warm saltwater or take cough drops to comfort your throat as directed by your health care provider.  Use a warm mist humidifier or inhale steam from a shower to increase air moisture. This may make it easier to breathe.  Drink enough fluid to keep your urine clear or pale yellow.   Eat soups and other clear broths and maintain good nutrition.   Rest as needed.   Return to work when your temperature has returned to normal or as your health care provider advises. You may need to stay home longer to avoid infecting others. You can also use a face mask and careful hand washing to prevent spread of the virus.  Increase the usage of your inhaler if you have asthma.   Do not use any tobacco products, including cigarettes, chewing tobacco, or electronic cigarettes. If you need help quitting, ask your health care provider. PREVENTION  The best way to protect yourself from getting a cold is to practice good hygiene.   Avoid oral or hand contact with people with cold   symptoms.   Wash your hands often if contact occurs.  There is no clear evidence that vitamin C, vitamin E, echinacea, or exercise reduces the chance of developing a cold. However, it is always recommended to get plenty of rest, exercise, and practice good nutrition.  SEEK MEDICAL CARE IF:   You are getting worse rather than better.   Your symptoms are not controlled by medicine.   You have chills.  You have worsening shortness of breath.  You have brown or red mucus.  You have yellow or brown nasal  discharge.  You have pain in your face, especially when you bend forward.  You have a fever.  You have swollen neck glands.  You have pain while swallowing.  You have white areas in the back of your throat. SEEK IMMEDIATE MEDICAL CARE IF:   You have severe or persistent:  Headache.  Ear pain.  Sinus pain.  Chest pain.  You have chronic lung disease and any of the following:  Wheezing.  Prolonged cough.  Coughing up blood.  A change in your usual mucus.  You have a stiff neck.  You have changes in your:  Vision.  Hearing.  Thinking.  Mood. MAKE SURE YOU:   Understand these instructions.  Will watch your condition.  Will get help right away if you are not doing well or get worse.   This information is not intended to replace advice given to you by your health care provider. Make sure you discuss any questions you have with your health care provider.   Document Released: 02/09/2001 Document Revised: 12/31/2014 Document Reviewed: 11/21/2013 Elsevier Interactive Patient Education 2016 [employer]  

## 2016-10-04 ENCOUNTER — Encounter (HOSPITAL_COMMUNITY): Payer: Self-pay

## 2016-10-04 DIAGNOSIS — R109 Unspecified abdominal pain: Secondary | ICD-10-CM | POA: Diagnosis present

## 2016-10-04 DIAGNOSIS — K219 Gastro-esophageal reflux disease without esophagitis: Secondary | ICD-10-CM | POA: Insufficient documentation

## 2016-10-04 DIAGNOSIS — Z23 Encounter for immunization: Secondary | ICD-10-CM | POA: Diagnosis not present

## 2016-10-04 DIAGNOSIS — K3580 Unspecified acute appendicitis: Secondary | ICD-10-CM | POA: Diagnosis not present

## 2016-10-04 DIAGNOSIS — K388 Other specified diseases of appendix: Secondary | ICD-10-CM | POA: Insufficient documentation

## 2016-10-04 MED ORDER — ACETAMINOPHEN 325 MG PO TABS
ORAL_TABLET | ORAL | Status: AC
  Administered 2016-10-04: 650 mg
  Filled 2016-10-04: qty 2

## 2016-10-04 NOTE — ED Triage Notes (Signed)
Pt endorses n/v/d, bodyaches and chills that began last night. Pt has not taken any medication. Febrile in triage 100.6 oral. Pt ambulatory.

## 2016-10-05 ENCOUNTER — Observation Stay (HOSPITAL_COMMUNITY): Payer: BLUE CROSS/BLUE SHIELD | Admitting: Certified Registered Nurse Anesthetist

## 2016-10-05 ENCOUNTER — Encounter (HOSPITAL_COMMUNITY): Payer: Self-pay | Admitting: Certified Registered Nurse Anesthetist

## 2016-10-05 ENCOUNTER — Encounter (HOSPITAL_COMMUNITY)
Admission: EM | Disposition: A | Discharge status: Home / Self Care | Payer: Self-pay | Source: Home / Self Care | Attending: Emergency Medicine

## 2016-10-05 ENCOUNTER — Emergency Department (HOSPITAL_COMMUNITY): Payer: BLUE CROSS/BLUE SHIELD

## 2016-10-05 ENCOUNTER — Observation Stay (HOSPITAL_COMMUNITY)
Admission: EM | Admit: 2016-10-05 | Discharge: 2016-10-06 | Disposition: A | Discharge status: Home / Self Care | Payer: BLUE CROSS/BLUE SHIELD | Attending: Surgery | Admitting: Surgery

## 2016-10-05 DIAGNOSIS — R109 Unspecified abdominal pain: Secondary | ICD-10-CM | POA: Diagnosis present

## 2016-10-05 HISTORY — PX: APPENDECTOMY: SHX54

## 2016-10-05 HISTORY — PX: LAPAROSCOPIC APPENDECTOMY: SHX408

## 2016-10-05 LAB — CBC WITH DIFFERENTIAL/PLATELET
BASOS ABS: 0 10*3/uL (ref 0.0–0.1)
Basophils Absolute: 0 10*3/uL (ref 0.0–0.1)
Basophils Relative: 0 %
Basophils Relative: 0 %
EOS PCT: 1 %
EOS PCT: 1 %
Eosinophils Absolute: 0 10*3/uL (ref 0.0–0.7)
Eosinophils Absolute: 0 10*3/uL (ref 0.0–0.7)
HEMATOCRIT: 33.3 % — AB (ref 36.0–46.0)
HEMATOCRIT: 42.4 % (ref 36.0–46.0)
HEMOGLOBIN: 15.2 g/dL — AB (ref 12.0–15.0)
Hemoglobin: 11.9 g/dL — ABNORMAL LOW (ref 12.0–15.0)
LYMPHS ABS: 0.7 10*3/uL (ref 0.7–4.0)
LYMPHS PCT: 12 %
LYMPHS PCT: 20 %
Lymphs Abs: 1.1 10*3/uL (ref 0.7–4.0)
MCH: 30 pg (ref 26.0–34.0)
MCH: 30.2 pg (ref 26.0–34.0)
MCHC: 35.8 g/dL (ref 30.0–36.0)
MCHC: 35.7 g/dL (ref 30.0–36.0)
MCV: 83.9 fL (ref 78.0–100.0)
MCV: 84.3 fL (ref 78.0–100.0)
MONO ABS: 0.6 10*3/uL (ref 0.1–1.0)
Monocytes Absolute: 0.7 10*3/uL (ref 0.1–1.0)
Monocytes Relative: 11 %
Monocytes Relative: 11 %
NEUTROS ABS: 3.7 10*3/uL (ref 1.7–7.7)
NEUTROS ABS: 4.7 10*3/uL (ref 1.7–7.7)
NEUTROS PCT: 76 %
Neutrophils Relative %: 69 %
PLATELETS: 270 10*3/uL (ref 150–400)
Platelets: 229 10*3/uL (ref 150–400)
RBC: 3.97 MIL/uL (ref 3.87–5.11)
RBC: 5.03 MIL/uL (ref 3.87–5.11)
RDW: 12 % (ref 11.5–15.5)
RDW: 12.1 % (ref 11.5–15.5)
WBC: 5.4 10*3/uL (ref 4.0–10.5)
WBC: 6.2 10*3/uL (ref 4.0–10.5)

## 2016-10-05 LAB — URINALYSIS, ROUTINE W REFLEX MICROSCOPIC
Bilirubin Urine: NEGATIVE
Glucose, UA: NEGATIVE mg/dL
KETONES UR: 20 mg/dL — AB
LEUKOCYTES UA: NEGATIVE
Nitrite: NEGATIVE
PH: 5 (ref 5.0–8.0)
PROTEIN: NEGATIVE mg/dL
Specific Gravity, Urine: 1.019 (ref 1.005–1.030)

## 2016-10-05 LAB — SURGICAL PCR SCREEN
MRSA, PCR: NEGATIVE
Staphylococcus aureus: NEGATIVE

## 2016-10-05 LAB — COMPREHENSIVE METABOLIC PANEL
ALT: 17 U/L (ref 14–54)
ANION GAP: 13 (ref 5–15)
AST: 25 U/L (ref 15–41)
Albumin: 4.5 g/dL (ref 3.5–5.0)
Alkaline Phosphatase: 77 U/L (ref 38–126)
BILIRUBIN TOTAL: 2.2 mg/dL — AB (ref 0.3–1.2)
BUN: 10 mg/dL (ref 6–20)
CHLORIDE: 102 mmol/L (ref 101–111)
CO2: 22 mmol/L (ref 22–32)
Calcium: 9.1 mg/dL (ref 8.9–10.3)
Creatinine, Ser: 0.97 mg/dL (ref 0.44–1.00)
Glucose, Bld: 95 mg/dL (ref 65–99)
POTASSIUM: 3 mmol/L — AB (ref 3.5–5.1)
Sodium: 137 mmol/L (ref 135–145)
TOTAL PROTEIN: 7.7 g/dL (ref 6.5–8.1)

## 2016-10-05 LAB — PREGNANCY, URINE: Preg Test, Ur: NEGATIVE

## 2016-10-05 LAB — LACTIC ACID, PLASMA
LACTIC ACID, VENOUS: 1 mmol/L (ref 0.5–1.9)
Lactic Acid, Venous: 2.1 mmol/L (ref 0.5–1.9)

## 2016-10-05 SURGERY — APPENDECTOMY, LAPAROSCOPIC
Anesthesia: General | Site: Abdomen

## 2016-10-05 MED ORDER — HYDROMORPHONE HCL 1 MG/ML IJ SOLN
INTRAMUSCULAR | Status: AC
  Filled 2016-10-05: qty 0.5

## 2016-10-05 MED ORDER — SODIUM CHLORIDE 0.9 % IV BOLUS (SEPSIS)
1000.0000 mL | Freq: Once | INTRAVENOUS | Status: AC
  Administered 2016-10-05: 1000 mL via INTRAVENOUS

## 2016-10-05 MED ORDER — DIPHENHYDRAMINE HCL 50 MG/ML IJ SOLN: 25.0000 mg | Freq: Four times a day (QID) | INTRAMUSCULAR | Status: DC | PRN

## 2016-10-05 MED ORDER — ONDANSETRON HCL 4 MG/2ML IJ SOLN
INTRAMUSCULAR | Status: AC
  Administered 2016-10-05: 4 mg via INTRAVENOUS
  Filled 2016-10-05: qty 2

## 2016-10-05 MED ORDER — HYDROMORPHONE HCL 1 MG/ML IJ SOLN
0.2500 mg | INTRAMUSCULAR | Status: DC | PRN
  Administered 2016-10-05 (×3): 0.5 mg via INTRAVENOUS

## 2016-10-05 MED ORDER — ONDANSETRON HCL 4 MG/2ML IJ SOLN
INTRAMUSCULAR | Status: AC
  Filled 2016-10-05: qty 2

## 2016-10-05 MED ORDER — ONDANSETRON 4 MG PO TBDP: 4.0000 mg | ORAL_TABLET | Freq: Four times a day (QID) | ORAL | Status: DC | PRN

## 2016-10-05 MED ORDER — POTASSIUM CHLORIDE CRYS ER 20 MEQ PO TBCR
40.0000 meq | EXTENDED_RELEASE_TABLET | Freq: Once | ORAL | Status: AC
  Administered 2016-10-05: 40 meq via ORAL
  Filled 2016-10-05: qty 2

## 2016-10-05 MED ORDER — LIDOCAINE HCL (CARDIAC) 20 MG/ML IV SOLN
INTRAVENOUS | Status: DC | PRN
  Administered 2016-10-05: 60 mg via INTRAVENOUS

## 2016-10-05 MED ORDER — FENTANYL CITRATE (PF) 100 MCG/2ML IJ SOLN
INTRAMUSCULAR | Status: AC
  Filled 2016-10-05: qty 4

## 2016-10-05 MED ORDER — OXYCODONE HCL 5 MG/5ML PO SOLN: 5.0000 mg | Freq: Once | ORAL | Status: DC | PRN

## 2016-10-05 MED ORDER — ROCURONIUM BROMIDE 100 MG/10ML IV SOLN
INTRAVENOUS | Status: DC | PRN
  Administered 2016-10-05: 30 mg via INTRAVENOUS

## 2016-10-05 MED ORDER — ONDANSETRON HCL 4 MG/2ML IJ SOLN
INTRAMUSCULAR | Status: DC | PRN
  Administered 2016-10-05: 4 mg via INTRAVENOUS

## 2016-10-05 MED ORDER — LACTATED RINGERS IV SOLN
INTRAVENOUS | Status: DC
  Administered 2016-10-05 (×2): via INTRAVENOUS

## 2016-10-05 MED ORDER — PROPOFOL 10 MG/ML IV BOLUS
INTRAVENOUS | Status: AC
  Filled 2016-10-05: qty 20

## 2016-10-05 MED ORDER — BUPIVACAINE-EPINEPHRINE 0.5% -1:200000 IJ SOLN
INTRAMUSCULAR | Status: DC | PRN
  Administered 2016-10-05: 4 mL

## 2016-10-05 MED ORDER — BUPIVACAINE-EPINEPHRINE (PF) 0.5% -1:200000 IJ SOLN
INTRAMUSCULAR | Status: AC
  Filled 2016-10-05: qty 30

## 2016-10-05 MED ORDER — PANTOPRAZOLE SODIUM 40 MG IV SOLR
40.0000 mg | Freq: Every day | INTRAVENOUS | Status: DC
  Administered 2016-10-05: 40 mg via INTRAVENOUS
  Filled 2016-10-05: qty 40

## 2016-10-05 MED ORDER — ACETAMINOPHEN 325 MG PO TABS
650.0000 mg | ORAL_TABLET | Freq: Once | ORAL | Status: AC
  Administered 2016-10-05: 650 mg via ORAL
  Filled 2016-10-05: qty 2

## 2016-10-05 MED ORDER — GLYCOPYRROLATE 0.2 MG/ML IV SOSY
PREFILLED_SYRINGE | INTRAVENOUS | Status: DC | PRN
  Administered 2016-10-05: 0.4 mg via INTRAVENOUS

## 2016-10-05 MED ORDER — FENTANYL CITRATE (PF) 100 MCG/2ML IJ SOLN
25.0000 ug | Freq: Once | INTRAMUSCULAR | Status: AC
  Administered 2016-10-05: 25 ug via INTRAVENOUS
  Filled 2016-10-05: qty 2

## 2016-10-05 MED ORDER — MIDAZOLAM HCL 5 MG/5ML IJ SOLN
INTRAMUSCULAR | Status: DC | PRN
  Administered 2016-10-05: 2 mg via INTRAVENOUS

## 2016-10-05 MED ORDER — NEOSTIGMINE METHYLSULFATE 5 MG/5ML IV SOSY
PREFILLED_SYRINGE | INTRAVENOUS | Status: DC | PRN
  Administered 2016-10-05: 3 mg via INTRAVENOUS

## 2016-10-05 MED ORDER — ONDANSETRON HCL 4 MG/2ML IJ SOLN
4.0000 mg | Freq: Four times a day (QID) | INTRAMUSCULAR | Status: DC | PRN
Start: 2016-10-05 — End: 2016-10-06
  Filled 2016-10-05 (×2): qty 2

## 2016-10-05 MED ORDER — ONDANSETRON HCL 4 MG/2ML IJ SOLN
4.0000 mg | Freq: Four times a day (QID) | INTRAMUSCULAR | Status: AC | PRN
  Administered 2016-10-05: 4 mg via INTRAVENOUS

## 2016-10-05 MED ORDER — WHITE PETROLATUM GEL
Status: AC
  Filled 2016-10-05: qty 1

## 2016-10-05 MED ORDER — MORPHINE SULFATE (PF) 4 MG/ML IV SOLN
2.0000 mg | INTRAVENOUS | Status: DC | PRN
  Administered 2016-10-05: 4 mg via INTRAVENOUS
  Filled 2016-10-05: qty 1

## 2016-10-05 MED ORDER — DEXTROSE 5 % IV SOLN
2.0000 g | INTRAVENOUS | Status: DC
  Administered 2016-10-05: 2 g via INTRAVENOUS
  Filled 2016-10-05 (×2): qty 2

## 2016-10-05 MED ORDER — PROPOFOL 10 MG/ML IV BOLUS
INTRAVENOUS | Status: DC | PRN
  Administered 2016-10-05: 150 mg via INTRAVENOUS

## 2016-10-05 MED ORDER — OXYCODONE HCL 5 MG PO TABS: 5.0000 mg | ORAL_TABLET | Freq: Once | ORAL | Status: DC | PRN

## 2016-10-05 MED ORDER — OXYCODONE-ACETAMINOPHEN 5-325 MG PO TABS
1.0000 | ORAL_TABLET | ORAL | Status: DC | PRN
  Administered 2016-10-06: 1 via ORAL
  Administered 2016-10-06: 2 via ORAL
  Filled 2016-10-05: qty 1
  Filled 2016-10-05: qty 2

## 2016-10-05 MED ORDER — DIPHENHYDRAMINE HCL 25 MG PO CAPS: 25.0000 mg | ORAL_CAPSULE | Freq: Four times a day (QID) | ORAL | Status: DC | PRN

## 2016-10-05 MED ORDER — INFLUENZA VAC SPLIT QUAD 0.5 ML IM SUSY
0.5000 mL | PREFILLED_SYRINGE | INTRAMUSCULAR | Status: AC
  Administered 2016-10-06: 0.5 mL via INTRAMUSCULAR

## 2016-10-05 MED ORDER — ONDANSETRON HCL 4 MG/2ML IJ SOLN
4.0000 mg | Freq: Once | INTRAMUSCULAR | Status: AC
  Administered 2016-10-05: 4 mg via INTRAVENOUS
  Filled 2016-10-05: qty 2

## 2016-10-05 MED ORDER — IOPAMIDOL (ISOVUE-300) INJECTION 61%
INTRAVENOUS | Status: AC
  Administered 2016-10-05: 100 mL
  Filled 2016-10-05: qty 100

## 2016-10-05 MED ORDER — DEXAMETHASONE SODIUM PHOSPHATE 10 MG/ML IJ SOLN
INTRAMUSCULAR | Status: DC | PRN
  Administered 2016-10-05: 10 mg via INTRAVENOUS

## 2016-10-05 MED ORDER — MIDAZOLAM HCL 2 MG/2ML IJ SOLN
INTRAMUSCULAR | Status: AC
  Filled 2016-10-05: qty 2

## 2016-10-05 MED ORDER — ACETAMINOPHEN 325 MG PO TABS
650.0000 mg | ORAL_TABLET | Freq: Four times a day (QID) | ORAL | Status: DC | PRN
  Administered 2016-10-05: 650 mg via ORAL
  Filled 2016-10-05: qty 2

## 2016-10-05 MED ORDER — 0.9 % SODIUM CHLORIDE (POUR BTL) OPTIME
TOPICAL | Status: DC | PRN
Start: 2016-10-05 — End: 2016-10-05
  Administered 2016-10-05: 1000 mL

## 2016-10-05 MED ORDER — SODIUM CHLORIDE 0.9 % IV SOLN
INTRAVENOUS | Status: DC
  Administered 2016-10-05 – 2016-10-06 (×2): via INTRAVENOUS

## 2016-10-05 MED ORDER — FENTANYL CITRATE (PF) 100 MCG/2ML IJ SOLN
INTRAMUSCULAR | Status: DC | PRN
Start: 2016-10-05 — End: 2016-10-05
  Administered 2016-10-05: 50 ug via INTRAVENOUS
  Administered 2016-10-05: 100 ug via INTRAVENOUS
  Administered 2016-10-05: 50 ug via INTRAVENOUS

## 2016-10-05 MED ORDER — HYDROMORPHONE HCL 1 MG/ML IJ SOLN
INTRAMUSCULAR | Status: AC
  Administered 2016-10-05: 0.5 mg via INTRAVENOUS
  Filled 2016-10-05: qty 0.5

## 2016-10-05 MED ORDER — ACETAMINOPHEN 650 MG RE SUPP: 650.0000 mg | Freq: Four times a day (QID) | RECTAL | Status: DC | PRN

## 2016-10-05 MED ORDER — METRONIDAZOLE IN NACL 5-0.79 MG/ML-% IV SOLN
500.0000 mg | Freq: Three times a day (TID) | INTRAVENOUS | Status: DC
  Administered 2016-10-05 – 2016-10-06 (×3): 500 mg via INTRAVENOUS
  Filled 2016-10-05 (×4): qty 100

## 2016-10-05 SURGICAL SUPPLY — 36 items
APPLIER CLIP ROT 10 11.4 M/L (STAPLE)
CANISTER SUCTION 2500CC (MISCELLANEOUS) ×2 IMPLANT
CHLORAPREP W/TINT 26ML (MISCELLANEOUS) ×2 IMPLANT
CLIP APPLIE ROT 10 11.4 M/L (STAPLE) IMPLANT
COVER SURGICAL LIGHT HANDLE (MISCELLANEOUS) ×2 IMPLANT
CUTTER FLEX LINEAR 45M (STAPLE) ×2 IMPLANT
DRAPE WARM FLUID 44X44 (DRAPE) ×2 IMPLANT
ELECT REM PT RETURN 9FT ADLT (ELECTROSURGICAL) ×2
ELECTRODE REM PT RTRN 9FT ADLT (ELECTROSURGICAL) ×1 IMPLANT
ENDOLOOP SUT PDS II  0 18 (SUTURE)
ENDOLOOP SUT PDS II 0 18 (SUTURE) IMPLANT
GLOVE BIO SURGEON STRL SZ8 (GLOVE) ×2 IMPLANT
GLOVE BIOGEL PI IND STRL 8 (GLOVE) ×1 IMPLANT
GLOVE BIOGEL PI INDICATOR 8 (GLOVE) ×1
GOWN STRL REUS W/ TWL LRG LVL3 (GOWN DISPOSABLE) ×2 IMPLANT
GOWN STRL REUS W/ TWL XL LVL3 (GOWN DISPOSABLE) ×1 IMPLANT
GOWN STRL REUS W/TWL LRG LVL3 (GOWN DISPOSABLE) ×2
GOWN STRL REUS W/TWL XL LVL3 (GOWN DISPOSABLE) ×1
KIT BASIN OR (CUSTOM PROCEDURE TRAY) ×2 IMPLANT
KIT ROOM TURNOVER OR (KITS) ×2 IMPLANT
LIQUID BAND (GAUZE/BANDAGES/DRESSINGS) ×2 IMPLANT
NS IRRIG 1000ML POUR BTL (IV SOLUTION) ×2 IMPLANT
PAD ARMBOARD 7.5X6 YLW CONV (MISCELLANEOUS) ×4 IMPLANT
POUCH SPECIMEN RETRIEVAL 10MM (ENDOMECHANICALS) ×2 IMPLANT
RELOAD STAPLE TA45 3.5 REG BLU (ENDOMECHANICALS) ×2 IMPLANT
SCALPEL HARMONIC ACE (MISCELLANEOUS) ×2 IMPLANT
SCISSORS LAP 5X35 DISP (ENDOMECHANICALS) ×2 IMPLANT
SET IRRIG TUBING LAPAROSCOPIC (IRRIGATION / IRRIGATOR) ×2 IMPLANT
SPECIMEN JAR SMALL (MISCELLANEOUS) ×2 IMPLANT
SUT MON AB 4-0 PC3 18 (SUTURE) ×2 IMPLANT
TOWEL OR 17X24 6PK STRL BLUE (TOWEL DISPOSABLE) ×2 IMPLANT
TRAY FOLEY CATH 16FR SILVER (SET/KITS/TRAYS/PACK) ×2 IMPLANT
TRAY LAPAROSCOPIC MC (CUSTOM PROCEDURE TRAY) ×2 IMPLANT
TROCAR XCEL BLADELESS 5X75MML (TROCAR) ×4 IMPLANT
TROCAR XCEL BLUNT TIP 100MML (ENDOMECHANICALS) ×2 IMPLANT
TUBING INSUFFLATION (TUBING) ×2 IMPLANT

## 2016-10-05 NOTE — Transfer of Care (Signed)
Immediate Anesthesia Transfer of Care Note  Patient: Jody Clark  Procedure(s) Performed: Procedure(s): APPENDECTOMY LAPAROSCOPIC (N/A)  Patient Location: PACU  Anesthesia Type:General  Level of Consciousness: awake, alert , oriented and patient cooperative  Airway & Oxygen Therapy: Patient connected to nasal cannula oxygen  Post-op Assessment: Report given to RN and Post -op Vital signs reviewed and stable  Post vital signs: Reviewed and stable  Last Vitals:  Vitals:   10/05/16 1339 10/05/16 1600  BP: 117/87   Pulse: (!) 110 (!) 104  Resp: 18 19  Temp:  36.2 C    Last Pain:  Vitals:   10/05/16 1233  TempSrc: Oral  PainSc:          Complications: No apparent anesthesia complications

## 2016-10-05 NOTE — Anesthesia Postprocedure Evaluation (Signed)
Anesthesia Post Note  Patient: Jody Clark  Procedure(s) Performed: Procedure(s) (LRB): APPENDECTOMY LAPAROSCOPIC (N/A)  Patient location during evaluation: PACU Anesthesia Type: General Level of consciousness: awake and alert Pain management: pain level controlled Vital Signs Assessment: post-procedure vital signs reviewed and stable Respiratory status: spontaneous breathing, nonlabored ventilation, respiratory function stable and patient connected to nasal cannula oxygen Cardiovascular status: blood pressure returned to baseline and stable Postop Assessment: no signs of nausea or vomiting Anesthetic complications: no       Last Vitals:  Vitals:   10/05/16 1645 10/05/16 1701  BP: 114/73 113/69  Pulse: 93 69  Resp: (!) 22 18  Temp: 36.6 C     Last Pain:  Vitals:   10/05/16 1630  TempSrc:   PainSc: 6                  Kennieth RadFitzgerald, Beverly Ferner E

## 2016-10-05 NOTE — Anesthesia Procedure Notes (Signed)
Procedure Name: Intubation Date/Time: 10/05/2016 2:59 PM Performed by: Shirlyn Goltz Pre-anesthesia Checklist: Suction available Patient Re-evaluated:Patient Re-evaluated prior to inductionOxygen Delivery Method: Circle system utilized Preoxygenation: Pre-oxygenation with 100% oxygen Intubation Type: IV induction Ventilation: Mask ventilation without difficulty Laryngoscope Size: Mac and 3 Grade View: Grade I Tube type: Oral Tube size: 7.0 mm Number of attempts: 1 Airway Equipment and Method: Stylet Placement Confirmation: ETT inserted through vocal cords under direct vision,  positive ETCO2 and breath sounds checked- equal and bilateral Secured at: 22 cm Tube secured with: Tape Dental Injury: Teeth and Oropharynx as per pre-operative assessment

## 2016-10-05 NOTE — Op Note (Signed)
Appendectomy, Lap, Procedure Note  Indications: The patient presented with a history of right-sided abdominal pain. A CT  revealed findings consistent with acute appendicitis. The procedure has been discussed with the patient.  Alternative therapies have been discussed with the patient.  Operative risks include bleeding,  Infection,  Organ injury,   Abscess, bowel injury,  Nerve injury,  Blood vessel injury,  DVT,  Pulmonary embolism,  Death,  And possible reoperation.  Medical management risks include worsening of present situation.  The success of the procedure is 50 -90 % at treating patients symptoms.  The patient understands and agrees to proceed.  Pre-operative Diagnosis: Acute appendicitis without mention of peritonitis  Post-operative Diagnosis: Same  Surgeon: Donzella Carrol A.   Assistants: OR staff  Anesthesia: General endotracheal anesthesia and Local anesthesia 0.25.% bupivacaine, with epinephrine  ASA Class: 1  Procedure Details  The patient was seen again in the Holding Room. The risks, benefits, complications, treatment options, and expected outcomes were discussed with the patient and/or family. The possibilities of reaction to medication, pulmonary aspiration, perforation of viscus, bleeding, recurrent infection, finding a normal appendix, the need for additional procedures, failure to diagnose a condition, and creating a complication requiring transfusion or operation were discussed. There was concurrence with the proposed plan and informed consent was obtained. The site of surgery was properly noted/marked. The patient was taken to Operating Room, identified as Jody Clark and the procedure verified as Appendectomy. A Time Out was held and the above information confirmed.  The patient was placed in the supine position and general anesthesia was induced, along with placement of orogastric tube, Venodyne boots, and a Foley catheter. The abdomen was prepped and draped in a  sterile fashion. A one centimeter infraumbilical incision was made and the peritoneal cavity was accessed using the OPEN  technique. The pneumoperitoneum was then established to steady pressure of 12 mmHg. A 12 mm port was placed through the umbilical incision. Additional 5 mm cannulas then placed in the lower midline and upper midline  of the abdomen  under direct vision. A careful evaluation of the entire abdomen was carried out. The patient was placed in Trendelenburg and left lateral decubitus position. The small intestines were retracted in the cephalad and left lateral direction away from the pelvis and right lower quadrant. The patient was found to have an enlarged and inflamed appendix that was extending into the pelvis. There was no evidence of perforation.  The appendix was carefully dissected. A window was made in the mesoappendix at the base of the appendix. A harmonic scalpel was used across the mesoappendix. The appendix was divided at its base using an endo-GIA stapler. Minimal appendiceal stump was left in place. There was no evidence of bleeding, leakage, or complication after division of the appendix. Irrigation was also performed and irrigate suctioned from the abdomen as well.  The umbilical port site was closed using 0 vicryl pursestring sutures fashion at the level of the fascia. The trocar site skin wounds were closed using skin staples.  Instrument, sponge, and needle counts were correct at the conclusion of the case.   Findings: The appendix was found to be inflamed. There were not signs of necrosis.  There was not perforation. There was not abscess formation.  Estimated Blood Loss:  less than 50 mL         Drains: none         Total IV Fluids: 800 mL         Specimens: appendix  Complications:  None; patient tolerated the procedure well.         Disposition: PACU - hemodynamically stable.         Condition: stable

## 2016-10-05 NOTE — ED Provider Notes (Signed)
[facility]-EMERGENCY DEPT Provider Note   CSN: [id number] Arrival date & time: 10/04/16  1831  By signing my name below, I, Alyssa Grove, attest that this documentation has been prepared under the direction and in the presence of Devoria Albe, MD. Electronically Signed: Alyssa Grove, ED Scribe. 10/05/16. 12:58 AM.  Time seen 12:47 AM   History   Chief Complaint Chief Complaint  Patient presents with  . Influenza   The history is provided by the patient. No language interpreter was used.   HPI Comments: Jody Clark is a 26 y.o. female who presents to the Emergency Department complaining of sudden onset, constant, right sided, periumbilical abdominal pain beginning yesterday. Pain is not exacerbated with walking, but is made worse with positional changes. She denies prior abdominal.  Pt reports associated diarrhea, ear pain, vomiting, generalized body aches, periumbilical abdominal pain, lower back pain, lower extremity achiness, intermittent dry coughing, clear rhinorrhea, sneezing. She has taken Tylenol with temporary moderate relief. Pt has experienced approximately 5 episodes of watery diarrhea that began yesterday. She has had 1 episode of vomiting. Pt denies known sick contact. She has not received a flu shot this year. Pt does not smoke, but drinks alcohol occasionally. She denies dizziness, lightheadedness, dry mouth, sore throat, dysuria, frequency, nausea. Denies chance of pregnancy. LMP was a few days ago.  PCP none  Past Medical History:  Diagnosis Date  . Acid reflux    There are no active problems to display for this patient.  History reviewed. No pertinent surgical history.  OB History    No data available     Home Medications    Prior to Admission medications   Not on File    Family History History reviewed. No pertinent family history.  Social History Social History  Substance Use Topics  . Smoking status: Never Smoker  . Smokeless tobacco: Not on file  .  Alcohol use Yes     Comment: occ  student   Allergies   Patient has no known allergies.   Review of Systems Review of Systems  HENT: Positive for ear pain, rhinorrhea and sneezing. Negative for sore throat.   Respiratory: Positive for cough.   Gastrointestinal: Positive for abdominal pain, diarrhea and vomiting. Negative for nausea.  Genitourinary: Negative for dysuria and frequency.  Musculoskeletal: Positive for back pain.       +Generalized body aches  Neurological: Negative for dizziness and light-headedness.       +Lower extremity tingling  All other systems reviewed and are negative.   Physical Exam Updated Vital Signs BP 119/79 (BP Location: Left Arm)   Pulse 118   Temp 99.9 F (37.7 C) (Oral)   Resp 16   Ht 5\' 2"  (1.575 m)   Wt 130 lb (59 kg)   LMP 09/24/2016   SpO2 100%   BMI 23.78 kg/m   Vital signs normal except for tachycardia   Physical Exam  Constitutional: She is oriented to person, place, and time. She appears well-developed and well-nourished.  Non-toxic appearance. She does not appear ill. No distress.  HENT:  Head: Normocephalic and atraumatic.  Right Ear: External ear normal.  Left Ear: External ear normal.  Nose: Nose normal. No mucosal edema or rhinorrhea.  Mouth/Throat: Oropharynx is clear and moist and mucous membranes are normal. No dental abscesses or uvula swelling.  Eyes: Conjunctivae and EOM are normal. Pupils are equal, round, and reactive to light.  Neck: Normal range of motion and full passive range of motion without pain.  Neck supple.  Cardiovascular: Normal rate, regular rhythm and normal heart sounds.  Exam reveals no gallop and no friction rub.   No murmur heard. Pulmonary/Chest: Effort normal and breath sounds normal. No respiratory distress. She has no wheezes. She has no rhonchi. She has no rales. She exhibits no tenderness and no crepitus.  Abdominal: Soft. Normal appearance and bowel sounds are normal. She exhibits no  distension. There is tenderness. There is CVA tenderness. There is no rebound and no guarding.    Diffuse tenderness in right abdomen worse in lateral right abdomen  Right CVAT   Musculoskeletal: Normal range of motion. She exhibits no edema or tenderness.  Moves all extremities well.   Neurological: She is alert and oriented to person, place, and time. She has normal strength. No cranial nerve deficit.  Skin: Skin is warm, dry and intact. No rash noted. No erythema. No pallor.  Psychiatric: She has a normal mood and affect. Her speech is normal and behavior is normal. Her mood appears not anxious.  Nursing note and vitals reviewed.   ED Treatments / Results  DIAGNOSTIC STUDIES: Oxygen Saturation is 100% on RA, normal by my interpretation.    Labs (all labs ordered are listed, but only abnormal results are displayed) Results for orders placed or performed during the hospital encounter of 10/05/16  Comprehensive metabolic panel  Result Value Ref Range   Sodium 137 135 - 145 mmol/L   Potassium 3.0 (L) 3.5 - 5.1 mmol/L   Chloride 102 101 - 111 mmol/L   CO2 22 22 - 32 mmol/L   Glucose, Bld 95 65 - 99 mg/dL   BUN 10 6 - 20 mg/dL   Creatinine, Ser 1.61 0.44 - 1.00 mg/dL   Calcium 9.1 8.9 - 09.6 mg/dL   Total Protein 7.7 6.5 - 8.1 g/dL   Albumin 4.5 3.5 - 5.0 g/dL   AST 25 15 - 41 U/L   ALT 17 14 - 54 U/L   Alkaline Phosphatase 77 38 - 126 U/L   Total Bilirubin 2.2 (H) 0.3 - 1.2 mg/dL   GFR calc non Af Amer >60 >60 mL/min   GFR calc Af Amer >60 >60 mL/min   Anion gap 13 5 - 15  CBC with Differential  Result Value Ref Range   WBC 6.2 4.0 - 10.5 K/uL   RBC 5.03 3.87 - 5.11 MIL/uL   Hemoglobin 15.2 (H) 12.0 - 15.0 g/dL   HCT 04.5 40.9 - 81.1 %   MCV 84.3 78.0 - 100.0 fL   MCH 30.2 26.0 - 34.0 pg   MCHC 35.8 30.0 - 36.0 g/dL   RDW 91.4 78.2 - 95.6 %   Platelets 270 150 - 400 K/uL   Neutrophils Relative % 76 %   Neutro Abs 4.7 1.7 - 7.7 K/uL   Lymphocytes Relative 12 %    Lymphs Abs 0.7 0.7 - 4.0 K/uL   Monocytes Relative 11 %   Monocytes Absolute 0.7 0.1 - 1.0 K/uL   Eosinophils Relative 1 %   Eosinophils Absolute 0.0 0.0 - 0.7 K/uL   Basophils Relative 0 %   Basophils Absolute 0.0 0.0 - 0.1 K/uL  Urinalysis, Routine w reflex microscopic  Result Value Ref Range   Color, Urine YELLOW YELLOW   APPearance HAZY (A) CLEAR   Specific Gravity, Urine 1.019 1.005 - 1.030   pH 5.0 5.0 - 8.0   Glucose, UA NEGATIVE NEGATIVE mg/dL   Hgb urine dipstick MODERATE (A) NEGATIVE   Bilirubin Urine  NEGATIVE NEGATIVE   Ketones, ur 20 (A) NEGATIVE mg/dL   Protein, ur NEGATIVE NEGATIVE mg/dL   Nitrite NEGATIVE NEGATIVE   Leukocytes, UA NEGATIVE NEGATIVE   RBC / HPF 0-5 0 - 5 RBC/hpf   WBC, UA 0-5 0 - 5 WBC/hpf   Bacteria, UA RARE (A) NONE SEEN   Squamous Epithelial / LPF 0-5 (A) NONE SEEN   Mucous PRESENT   Lactic acid, plasma  Result Value Ref Range   Lactic Acid, Venous 2.1 (HH) 0.5 - 1.9 mmol/L  Lactic acid, plasma  Result Value Ref Range   Lactic Acid, Venous 1.0 0.5 - 1.9 mmol/L  Pregnancy, urine  Result Value Ref Range   Preg Test, Ur NEGATIVE NEGATIVE  CBC with Differential/Platelet  Result Value Ref Range   WBC 5.4 4.0 - 10.5 K/uL   RBC 3.97 3.87 - 5.11 MIL/uL   Hemoglobin 11.9 (L) 12.0 - 15.0 g/dL   HCT 69.6 (L) 29.5 - 28.4 %   MCV 83.9 78.0 - 100.0 fL   MCH 30.0 26.0 - 34.0 pg   MCHC 35.7 30.0 - 36.0 g/dL   RDW 13.2 44.0 - 10.2 %   Platelets 229 150 - 400 K/uL   Neutrophils Relative % 69 %   Neutro Abs 3.7 1.7 - 7.7 K/uL   Lymphocytes Relative 20 %   Lymphs Abs 1.1 0.7 - 4.0 K/uL   Monocytes Relative 11 %   Monocytes Absolute 0.6 0.1 - 1.0 K/uL   Eosinophils Relative 1 %   Eosinophils Absolute 0.0 0.0 - 0.7 K/uL   Basophils Relative 0 %   Basophils Absolute 0.0 0.0 - 0.1 K/uL   Laboratory interpretation all normal except leukocytosis, hypokalemia, elevated LA    EKG  EKG Interpretation None       Radiology Ct Abdomen Pelvis W  Contrast  Result Date: 10/05/2016 CLINICAL DATA:  25 y/o  F; right lower quadrant abdominal pain. EXAM: CT ABDOMEN AND PELVIS WITH CONTRAST TECHNIQUE: Multidetector CT imaging of the abdomen and pelvis was performed using the standard protocol following bolus administration of intravenous contrast. CONTRAST:  ISOVUE-300 IOPAMIDOL (ISOVUE-300) INJECTION 61% COMPARISON:  None. FINDINGS: Lower chest: No acute abnormality. Hepatobiliary: No focal liver abnormality is seen. No gallstones, gallbladder wall thickening, or biliary dilatation. Pancreas: Unremarkable. No pancreatic ductal dilatation or surrounding inflammatory changes. Spleen: Normal in size without focal abnormality. Adrenals/Urinary Tract: Adrenal glands are unremarkable. Kidneys are normal, without renal calculi, focal lesion, or hydronephrosis. Bladder is unremarkable. Stomach/Bowel: Stomach is within normal limits. Prominent size appendix measuring up to 8 mm in diameter, no secondary signs of appendicitis. No evidence of bowel wall thickening, distention, or inflammatory changes. Vascular/Lymphatic: No significant vascular findings are present. No enlarged abdominal or pelvic lymph nodes. Reproductive: Uterus and bilateral adnexa are unremarkable. Other: No abdominal wall hernia or abnormality. No abdominopelvic ascites. Musculoskeletal: No acute or significant osseous findings. IMPRESSION: The appendix measures up to 8 mm in diameter, no secondary signs of appendicitis. Findings are indeterminate for acute appendicitis. Otherwise unremarkable CT of abdomen and pelvis. Electronically Signed   By: Mitzi Hansen M.D.   On: 10/05/2016 02:34    Procedures Procedures (including critical care time)  Medications Ordered in ED Medications  acetaminophen (TYLENOL) 325 MG tablet (650 mg  Given 10/04/16 1930)  sodium chloride 0.9 % bolus 1,000 mL (0 mLs Intravenous Stopped 10/05/16 0256)  sodium chloride 0.9 % bolus 1,000 mL (0 mLs  Intravenous Stopped 10/05/16 0256)  ondansetron (ZOFRAN) injection 4 mg (  4 mg Intravenous Given 10/05/16 0134)  fentaNYL (SUBLIMAZE) injection 25 mcg (25 mcg Intravenous Given 10/05/16 0134)  acetaminophen (TYLENOL) tablet 650 mg (650 mg Oral Given 10/05/16 0155)  iopamidol (ISOVUE-300) 61 % injection (100 mLs  Contrast Given 10/05/16 0212)  potassium chloride SA (K-DUR,KLOR-CON) CR tablet 40 mEq (40 mEq Oral Given 10/05/16 0555)     Initial Impression / Assessment and Plan / ED Course  I have reviewed the triage vital signs and the nursing notes.  Pertinent labs & imaging results that were available during my care of the patient were reviewed by me and considered in my medical decision making (see chart for details).    COORDINATION OF CARE: 12:49 AM Discussed treatment plan with pt at bedside which includes CT Abdomen, urinalysis, IV fluids, CBC with differential, CMP, and Lactic and pt agreed to plan.  3:53 AM: Recheck; Pt states pain has significantly improved with treatment.   3:58 AM Tender just lateral to the umbilicus on the right side  04:12 AM Dr Donell BeersByerly, repeat CBC, have PA see in am around 7.   Pt made aware of plan  08:07 AM Jessica, surgery PA will come see patient.    Final Clinical Impressions(s) / ED Diagnoses   Final diagnoses:  Right sided abdominal pain    Disposition pending surgery evaluation  Devoria AlbeIva Jarrah Babich, MD, Concha PyoFACEP     Daira Hine, MD 10/05/16 [phone]

## 2016-10-05 NOTE — Anesthesia Preprocedure Evaluation (Signed)
Anesthesia Evaluation  Patient identified by MRN, date of birth, ID band Patient awake    Reviewed: Allergy & Precautions, H&P , NPO status , Patient's Chart, lab work & pertinent test results  Airway Mallampati: II   Neck ROM: full    Dental   Pulmonary neg pulmonary ROS,    breath sounds clear to auscultation       Cardiovascular negative cardio ROS   Rhythm:regular Rate:Normal     Neuro/Psych    GI/Hepatic GERD  ,  Endo/Other    Renal/GU      Musculoskeletal   Abdominal   Peds  Hematology   Anesthesia Other Findings   Reproductive/Obstetrics                             Anesthesia Physical Anesthesia Plan  ASA: II  Anesthesia Plan: General   Post-op Pain Management:    Induction: Intravenous  Airway Management Planned: Oral ETT  Additional Equipment:   Intra-op Plan:   Post-operative Plan: Extubation in OR  Informed Consent: I have reviewed the patients History and Physical, chart, labs and discussed the procedure including the risks, benefits and alternatives for the proposed anesthesia with the patient or authorized representative who has indicated his/her understanding and acceptance.     Plan Discussed with: CRNA, Anesthesiologist and Surgeon  Anesthesia Plan Comments:         Anesthesia Quick Evaluation  

## 2016-10-05 NOTE — ED Notes (Signed)
Pt stating that she is feeling numbness in her legs.

## 2016-10-05 NOTE — H&P (Signed)
[facility] Consult/Admission Note  Jody Clark [birth year]  [id number].    Requesting MD: Dr. Rolland Porter Chief Complaint/Reason for Consult: possible appendicitis  HPI:   Patient is a 26 year old female with a past history of acid reflux who presented to the [facility] ED with complaints of abdominal pain that began yesterday. Patient states yesterday morning she had progressively worsening lower back pain that moved into her right lower quadrant. The pain was constant, non radiating, severe. Associated 1 episode of vomiting, 4-5 episodes of watery diarrhea, subjective fevers, chills, generalized body aches, intermittent dry cough. She took Tylenol with little relief. Patient denies chest pain, shortness of breath, blood in her vomit, blood in her stools, abnormal vaginal discharge, dysuria, hematuria.  ED course: Labs: Lactic acid 2.1, potassium 3, total bilirubin 2.2, febrile 101.3, all other labs unremarkable CT abdomen and pelvis: Appendix measuring up to 8 mm in diameter, no secondary signs of appendicitis. Findings are indeterminate for acute appendicitis.  ROS:  Review of Systems  Constitutional: Positive for chills and fever. Negative for diaphoresis.  HENT: Negative for sore throat.   Eyes: Negative for discharge.  Respiratory: Positive for cough. Negative for shortness of breath.   Cardiovascular: Negative for chest pain and leg swelling.  Gastrointestinal: Positive for abdominal pain, diarrhea, nausea and vomiting. Negative for blood in stool.  Genitourinary: Negative for dysuria and hematuria.  Musculoskeletal: Positive for back pain and myalgias. Negative for falls.  Skin: Negative for rash.  Neurological: Negative for dizziness, loss of consciousness and headaches.  All other systems reviewed and are negative.    History reviewed. No pertinent family history.  Past Medical History:  Diagnosis Date  . Acid reflux     History reviewed. No pertinent surgical  history.  Social History:  reports that she has never smoked. She does not have any smokeless tobacco history on file. She reports that she drinks alcohol. She reports that she does not use drugs.  Allergies: No Known Allergies   (Not in a hospital admission)  Blood pressure 97/63, pulse 92, temperature 101.3 F (38.5 C), temperature source Oral, resp. rate 18, height '5\' 2"'  (1.575 m), weight 130 lb (59 kg), last menstrual period 09/24/2016, SpO2 99 %.  Physical Exam  Constitutional: She is oriented to person, place, and time and well-developed, well-nourished, and in no distress. Vital signs are normal. No distress.  HENT:  Head: Normocephalic and atraumatic.  Nose: Nose normal.  Mouth/Throat: Oropharynx is clear and moist.  Eyes: Conjunctivae are normal. Right eye exhibits no discharge. Left eye exhibits no discharge. No scleral icterus.  Neck: Normal range of motion. Neck supple.  Cardiovascular: Normal rate, regular rhythm, normal heart sounds and intact distal pulses.  Exam reveals no gallop and no friction rub.   No murmur heard. Pulses:      Radial pulses are 2+ on the right side, and 2+ on the left side.       Dorsalis pedis pulses are 2+ on the right side, and 2+ on the left side.  Pulmonary/Chest: Effort normal and breath sounds normal. No respiratory distress. She has no decreased breath sounds. She has no wheezes. She has no rhonchi. She has no rales.  Abdominal: Soft. Normal appearance and bowel sounds are normal. She exhibits no distension and no mass. There is tenderness. There is no rebound and no guarding.    TTP in right lower quadrant and suprapubic region  Musculoskeletal: Normal range of motion. She exhibits no edema or deformity.  Neurological:  She is alert and oriented to person, place, and time.  Skin: Skin is warm and dry. No rash noted. She is not diaphoretic.  Psychiatric: Mood and affect normal.  Nursing note and vitals reviewed.   Results for orders  placed or performed during the hospital encounter of 10/05/16 (from the past 48 hour(s))  Comprehensive metabolic panel     Status: Abnormal   Collection Time: 10/05/16 12:54 AM  Result Value Ref Range   Sodium 137 135 - 145 mmol/L   Potassium 3.0 (L) 3.5 - 5.1 mmol/L   Chloride 102 101 - 111 mmol/L   CO2 22 22 - 32 mmol/L   Glucose, Bld 95 65 - 99 mg/dL   BUN 10 6 - 20 mg/dL   Creatinine, Ser 0.97 0.44 - 1.00 mg/dL   Calcium 9.1 8.9 - 10.3 mg/dL   Total Protein 7.7 6.5 - 8.1 g/dL   Albumin 4.5 3.5 - 5.0 g/dL   AST 25 15 - 41 U/L   ALT 17 14 - 54 U/L   Alkaline Phosphatase 77 38 - 126 U/L   Total Bilirubin 2.2 (H) 0.3 - 1.2 mg/dL   GFR calc non Af Amer >60 >60 mL/min   GFR calc Af Amer >60 >60 mL/min    Comment: (NOTE) The eGFR has been calculated using the CKD EPI equation. This calculation has not been validated in all clinical situations. eGFR's persistently <60 mL/min signify possible Chronic Kidney Disease.    Anion gap 13 5 - 15  CBC with Differential     Status: Abnormal   Collection Time: 10/05/16 12:54 AM  Result Value Ref Range   WBC 6.2 4.0 - 10.5 K/uL   RBC 5.03 3.87 - 5.11 MIL/uL   Hemoglobin 15.2 (H) 12.0 - 15.0 g/dL   HCT 42.4 36.0 - 46.0 %   MCV 84.3 78.0 - 100.0 fL   MCH 30.2 26.0 - 34.0 pg   MCHC 35.8 30.0 - 36.0 g/dL   RDW 12.0 11.5 - 15.5 %   Platelets 270 150 - 400 K/uL   Neutrophils Relative % 76 %   Neutro Abs 4.7 1.7 - 7.7 K/uL   Lymphocytes Relative 12 %   Lymphs Abs 0.7 0.7 - 4.0 K/uL   Monocytes Relative 11 %   Monocytes Absolute 0.7 0.1 - 1.0 K/uL   Eosinophils Relative 1 %   Eosinophils Absolute 0.0 0.0 - 0.7 K/uL   Basophils Relative 0 %   Basophils Absolute 0.0 0.0 - 0.1 K/uL  Lactic acid, plasma     Status: Abnormal   Collection Time: 10/05/16 12:55 AM  Result Value Ref Range   Lactic Acid, Venous 2.1 (HH) 0.5 - 1.9 mmol/L    Comment: CRITICAL RESULT CALLED TO, READ BACK BY AND VERIFIED WITH: J.Mila Palmer [id number] 10/05/16  M.CAMPBELL   Urinalysis, Routine w reflex microscopic     Status: Abnormal   Collection Time: 10/05/16  1:25 AM  Result Value Ref Range   Color, Urine YELLOW YELLOW   APPearance HAZY (A) CLEAR   Specific Gravity, Urine 1.019 1.005 - 1.030   pH 5.0 5.0 - 8.0   Glucose, UA NEGATIVE NEGATIVE mg/dL   Hgb urine dipstick MODERATE (A) NEGATIVE   Bilirubin Urine NEGATIVE NEGATIVE   Ketones, ur 20 (A) NEGATIVE mg/dL   Protein, ur NEGATIVE NEGATIVE mg/dL   Nitrite NEGATIVE NEGATIVE   Leukocytes, UA NEGATIVE NEGATIVE   RBC / HPF 0-5 0 - 5 RBC/hpf   WBC, UA 0-5 0 -  5 WBC/hpf   Bacteria, UA RARE (A) NONE SEEN   Squamous Epithelial / LPF 0-5 (A) NONE SEEN   Mucous PRESENT   Pregnancy, urine     Status: None   Collection Time: 10/05/16  1:25 AM  Result Value Ref Range   Preg Test, Ur NEGATIVE NEGATIVE    Comment:        THE SENSITIVITY OF THIS METHODOLOGY IS >20 mIU/mL.   Lactic acid, plasma     Status: None   Collection Time: 10/05/16  3:46 AM  Result Value Ref Range   Lactic Acid, Venous 1.0 0.5 - 1.9 mmol/L  CBC with Differential/Platelet     Status: Abnormal   Collection Time: 10/05/16  5:03 AM  Result Value Ref Range   WBC 5.4 4.0 - 10.5 K/uL   RBC 3.97 3.87 - 5.11 MIL/uL   Hemoglobin 11.9 (L) 12.0 - 15.0 g/dL    Comment: DELTA CHECK NOTED REPEATED TO VERIFY    HCT 33.3 (L) 36.0 - 46.0 %   MCV 83.9 78.0 - 100.0 fL   MCH 30.0 26.0 - 34.0 pg   MCHC 35.7 30.0 - 36.0 g/dL   RDW 12.1 11.5 - 15.5 %   Platelets 229 150 - 400 K/uL   Neutrophils Relative % 69 %   Neutro Abs 3.7 1.7 - 7.7 K/uL   Lymphocytes Relative 20 %   Lymphs Abs 1.1 0.7 - 4.0 K/uL   Monocytes Relative 11 %   Monocytes Absolute 0.6 0.1 - 1.0 K/uL   Eosinophils Relative 1 %   Eosinophils Absolute 0.0 0.0 - 0.7 K/uL   Basophils Relative 0 %   Basophils Absolute 0.0 0.0 - 0.1 K/uL   Ct Abdomen Pelvis W Contrast  Result Date: 10/05/2016 CLINICAL DATA:  26 y/o  F; right lower quadrant abdominal pain. EXAM: CT  ABDOMEN AND PELVIS WITH CONTRAST TECHNIQUE: Multidetector CT imaging of the abdomen and pelvis was performed using the standard protocol following bolus administration of intravenous contrast. CONTRAST:  163m ISOVUE-300 IOPAMIDOL (ISOVUE-300) INJECTION 61% COMPARISON:  None. FINDINGS: Lower chest: No acute abnormality. Hepatobiliary: No focal liver abnormality is seen. No gallstones, gallbladder wall thickening, or biliary dilatation. Pancreas: Unremarkable. No pancreatic ductal dilatation or surrounding inflammatory changes. Spleen: Normal in size without focal abnormality. Adrenals/Urinary Tract: Adrenal glands are unremarkable. Kidneys are normal, without renal calculi, focal lesion, or hydronephrosis. Bladder is unremarkable. Stomach/Bowel: Stomach is within normal limits. Prominent size appendix measuring up to 8 mm in diameter, no secondary signs of appendicitis. No evidence of bowel wall thickening, distention, or inflammatory changes. Vascular/Lymphatic: No significant vascular findings are present. No enlarged abdominal or pelvic lymph nodes. Reproductive: Uterus and bilateral adnexa are unremarkable. Other: No abdominal wall hernia or abnormality. No abdominopelvic ascites. Musculoskeletal: No acute or significant osseous findings. IMPRESSION: The appendix measures up to 8 mm in diameter, no secondary signs of appendicitis. Findings are indeterminate for acute appendicitis. Otherwise unremarkable CT of abdomen and pelvis. Electronically Signed   By: LKristine GarbeM.D.   On: 10/05/2016 02:34      Assessment/Plan  Possible appendicitis - CT scan results not definitive for appendicitis although patient is febrile and tender in her right lower quadrant. Clinically I believe this patient has appendicitis - Admit to our service - Patient will need to the OR for appendectomy  Nothing by mouth, IV antibiotics  Will need to confirm plan with MD.  It was a pleasure meeting Miss Tetzloff.  Thank you for the consult.  Kalman Drape, [facility] Surgery 10/05/2016, 9:08 AM Pager: [phone] Consults: [phone] Mon-Fri 7:00 am-4:30 pm Sat-Sun 7:00 am-11:30 am

## 2016-10-06 ENCOUNTER — Encounter (HOSPITAL_COMMUNITY): Payer: Self-pay | Admitting: Surgery

## 2016-10-06 LAB — BASIC METABOLIC PANEL
ANION GAP: 9 (ref 5–15)
BUN: <5 mg/dL — ABNORMAL LOW (ref 6–20)
CALCIUM: 8.6 mg/dL — AB (ref 8.9–10.3)
CO2: 24 mmol/L (ref 22–32)
Chloride: 105 mmol/L (ref 101–111)
Creatinine, Ser: 0.84 mg/dL (ref 0.44–1.00)
GFR calc Af Amer: >60 mL/min (ref >60)
GLUCOSE: 112 mg/dL — AB (ref 65–99)
POTASSIUM: 3.7 mmol/L (ref 3.5–5.1)
SODIUM: 138 mmol/L (ref 135–145)

## 2016-10-06 LAB — CBC
HEMATOCRIT: 33.1 % — AB (ref 36.0–46.0)
HEMOGLOBIN: 11.7 g/dL — AB (ref 12.0–15.0)
MCH: 29.6 pg (ref 26.0–34.0)
MCHC: 35.3 g/dL (ref 30.0–36.0)
MCV: 83.8 fL (ref 78.0–100.0)
Platelets: 231 10*3/uL (ref 150–400)
RBC: 3.95 MIL/uL (ref 3.87–5.11)
RDW: 12 % (ref 11.5–15.5)
WBC: 3.7 10*3/uL — AB (ref 4.0–10.5)

## 2016-10-06 MED ORDER — OXYCODONE-ACETAMINOPHEN 5-325 MG PO TABS: 1.0000 | ORAL_TABLET | ORAL | 0 refills | Status: DC | PRN

## 2016-10-06 NOTE — Discharge Instructions (Signed)
Please arrive at least 30 min before your appointment to complete your check in paperwork.  If you are unable to arrive 30 min prior to your appointment time we may have to cancel or reschedule you.  LAPAROSCOPIC SURGERY: POST OP INSTRUCTIONS  1. DIET: Follow a light bland diet the first 24 hours after arrival home, such as soup, liquids, crackers, etc. Be sure to include lots of fluids daily. Avoid fast food or heavy meals as your are more likely to get nauseated. Eat a low fat the next few days after surgery.  2. Take your usually prescribed home medications unless otherwise directed. 3. PAIN CONTROL:  1. Pain is best controlled by a usual combination of three different methods TOGETHER:  1. Ice/Heat 2. Over the counter pain medication 3. Prescription pain medication 2. Most patients will experience some swelling and bruising around the incisions. Ice packs or heating pads (30-60 minutes up to 6 times a day) will help. Use ice for the first few days to help decrease swelling and bruising, then switch to heat to help relax tight/sore spots and speed recovery. Some people prefer to use ice alone, heat alone, alternating between ice & heat. Experiment to what works for you. Swelling and bruising can take several weeks to resolve.  3. It is helpful to take an over-the-counter pain medication regularly for the first few weeks. Choose one of the following that works best for you:  1. Naproxen (Aleve, etc) Two 220mg  tabs twice a day 2. Ibuprofen (Advil, etc) Three 200mg  tabs four times a day (every meal & bedtime) 3. Acetaminophen (Tylenol, etc) 500-650mg  four times a day (every meal & bedtime) 4. A prescription for pain medication (such as oxycodone, hydrocodone, etc) should be given to you upon discharge. Take your pain medication as prescribed.  1. If you are having problems/concerns with the prescription medicine (does not control pain, nausea, vomiting, rash, itching, etc), please call us [phone] to see if we need to switch you to a different pain medicine that will work better for you and/or control your side effect better. 2. If you need a refill on your pain medication, please contact your pharmacy. They will contact our office to request authorization. Prescriptions will not be filled after 5 pm or on week-ends. 4. Avoid getting constipated. Between the surgery and the pain medications, it is common to experience some constipation. Increasing fluid intake and taking a fiber supplement (such as Metamucil, Citrucel, FiberCon, MiraLax, etc) 1-2 times a day regularly and a stool softener will usually help prevent this problem from occurring. A mild laxative (prune juice, Milk of Magnesia, MiraLax, etc) should be taken according to package directions if there are no bowel movements after 48 hours.  5. Watch out for diarrhea. If you have many loose bowel movements, simplify your diet to bland foods & liquids for a few days. Stop any stool softeners and decrease your fiber supplement. Switching to mild anti-diarrheal medications (Kayopectate, Pepto Bismol) can help. If this worsens or does not improve, please call us. 6. Wash / shower every day. You may shower over the dressings as they are waterproof. Continue to shower over incision(s) after the dressing is off. 7. Remove your waterproof bandages 5 days after surgery. You may leave the incision open to air. You may replace a dressing/Band-Aid to cover the incision for comfort if you wish.  8. ACTIVITIES as tolerated:  1. You may resume regular (light) daily activities beginning the next day--such as daily  self-care, walking, climbing stairs--gradually increasing activities as tolerated. If you can walk 30 minutes without difficulty, it is safe to try more intense activity such as jogging, treadmill, bicycling, low-impact aerobics, swimming, etc. 2. Save the most intensive and strenuous activity for last such as sit-ups, heavy lifting, contact  sports, etc Refrain from any heavy lifting or straining until you are off narcotics for pain control.  3. DO NOT PUSH THROUGH PAIN. Let pain be your guide: If it hurts to do something, don't do it. Pain is your body warning you to avoid that activity for another week until the pain goes down. 4. You may drive when you are no longer taking prescription pain medication, you can comfortably wear a seatbelt, and you can safely maneuver your car and apply brakes. 5. You may have sexual intercourse when it is comfortable.  9. FOLLOW UP in our office  1. Please call [facility] at [phone] to set up an appointment to see your surgeon in the office for a follow-up appointment approximately 2-3 weeks after your surgery. 2. Make sure that you call for this appointment the day you arrive home to insure a convenient appointment time.      10. IF YOU HAVE DISABILITY OR FAMILY LEAVE FORMS, BRING THEM TO THE               OFFICE FOR PROCESSING.   WHEN TO CALL US [phone]:  1. Poor pain control 2. Reactions / problems with new medications (rash/itching, nausea, etc)  3. Fever over 101.5 F (38.5 C) 4. Inability to urinate 5. Nausea and/or vomiting 6. Worsening swelling or bruising 7. Continued bleeding from incision. 8. Increased pain, redness, or drainage from the incision  The clinic staff is available to answer your questions during regular business hours (8:30am-5pm). Please dont hesitate to call and ask to speak to one of our nurses for clinical concerns.  If you have a medical emergency, go to the nearest emergency room or call 911.  A surgeon from [facility] is always on call at the [facility]   [facility], [place]  [address], [address], [place], [place] [address] ?  MAIN: [phone] ? TOLL FREE: [phone] ?  FAX [phone]  [email or web address]

## 2016-10-06 NOTE — Progress Notes (Signed)
[facility] Progress Note  1 Day Post-Op  Subjective: Feels good. No overnight events. Wants to go home. Tolerating diet.  Objective: Vital signs in last 24 hours: Temp:  [97.2 F (36.2 C)-99.2 F (37.3 C)] 98.6 F (37 C) (02/07 0606) Pulse Rate:  [69-110] 90 (02/07 0606) Resp:  [11-22] 18 (02/07 0606) BP: (97-117)/(46-91) 104/46 (02/07 0606) SpO2:  [99 %-100 %] 99 % (02/07 0606) Last BM Date: 10/04/16  Intake/Output from previous day: 02/06 0701 - 02/07 0700 In: 4045.3 [P.O.:582; I.V.:3113.3; IV Piggyback:350] Out: 2975 [Urine:2950; Blood:25] Intake/Output this shift: No intake/output data recorded.  PE: Gen:  Alert, NAD, pleasant, cooperative, appears well, lying in bed Card:  RRR, no M/G/R heard Pulm: rate and effort normal Abd: Soft, nondistended, +BS, mild TTP around incisions Skin: no rashes noted, warm and dry  Lab Results:   Recent Labs  10/05/16 0503 10/06/16 0542  WBC 5.4 3.7*  HGB 11.9* 11.7*  HCT 33.3* 33.1*  PLT 229 231   BMET  Recent Labs  10/05/16 0054 10/06/16 0542  NA 137 138  K 3.0* 3.7  CL 102 105  CO2 22 24  GLUCOSE 95 112*  BUN 10 <5*  CREATININE 0.97 0.84  CALCIUM 9.1 8.6*   PT/INR No results for input(s): LABPROT, INR in the last 72 hours. CMP     Component Value Date/Time   NA 138 10/06/2016 0542   K 3.7 10/06/2016 0542   CL 105 10/06/2016 0542   CO2 24 10/06/2016 0542   GLUCOSE 112 (H) 10/06/2016 0542   BUN <5 (L) 10/06/2016 0542   CREATININE 0.84 10/06/2016 0542   CALCIUM 8.6 (L) 10/06/2016 0542   PROT 7.7 10/05/2016 0054   ALBUMIN 4.5 10/05/2016 0054   AST 25 10/05/2016 0054   ALT 17 10/05/2016 0054   ALKPHOS 77 10/05/2016 0054   BILITOT 2.2 (H) 10/05/2016 0054   GFRNONAA >60 10/06/2016 0542   GFRAA >60 10/06/2016 0542   Lipase  No results found for: LIPASE     Studies/Results: Ct Abdomen Pelvis W Contrast  Result Date: 10/05/2016 CLINICAL DATA:  26 y/o  F; right lower quadrant abdominal  pain. EXAM: CT ABDOMEN AND PELVIS WITH CONTRAST TECHNIQUE: Multidetector CT imaging of the abdomen and pelvis was performed using the standard protocol following bolus administration of intravenous contrast. CONTRAST:  100mL ISOVUE-300 IOPAMIDOL (ISOVUE-300) INJECTION 61% COMPARISON:  None. FINDINGS: Lower chest: No acute abnormality. Hepatobiliary: No focal liver abnormality is seen. No gallstones, gallbladder wall thickening, or biliary dilatation. Pancreas: Unremarkable. No pancreatic ductal dilatation or surrounding inflammatory changes. Spleen: Normal in size without focal abnormality. Adrenals/Urinary Tract: Adrenal glands are unremarkable. Kidneys are normal, without renal calculi, focal lesion, or hydronephrosis. Bladder is unremarkable. Stomach/Bowel: Stomach is within normal limits. Prominent size appendix measuring up to 8 mm in diameter, no secondary signs of appendicitis. No evidence of bowel wall thickening, distention, or inflammatory changes. Vascular/Lymphatic: No significant vascular findings are present. No enlarged abdominal or pelvic lymph nodes. Reproductive: Uterus and bilateral adnexa are unremarkable. Other: No abdominal wall hernia or abnormality. No abdominopelvic ascites. Musculoskeletal: No acute or significant osseous findings. IMPRESSION: The appendix measures up to 8 mm in diameter, no secondary signs of appendicitis. Findings are indeterminate for acute appendicitis. Otherwise unremarkable CT of abdomen and pelvis. Electronically Signed   By: Mitzi HansenLance  Furusawa-Stratton M.D.   On: 10/05/2016 02:34    Anti-infectives: Anti-infectives    Start     Dose/Rate Route Frequency Ordered Stop   10/05/16 1200  metroNIDAZOLE (FLAGYL) IVPB 500 mg     500 mg 100 mL/hr over 60 Minutes Intravenous Every 8 hours 10/05/16 1032     10/05/16 1100  cefTRIAXone (ROCEPHIN) 2 g in dextrose 5 % 50 mL IVPB     2 g 100 mL/hr over 30 Minutes Intravenous Every 24 hours 10/05/16 1032          Assessment/Plan  Appendicitis S/P appendectomy, Dr. Luisa Hart, 10/05/16 - labs unremarkable this AM - pt ambulating well - tolerating diet  Will be d/c home today. No need for PO antibiotics    LOS: 0 days    Jerre Simon , [facility] 10/06/2016, 8:24 AM Pager: [phone] Consults: [phone] Mon-Fri 7:00 am-4:30 pm Sat-Sun 7:00 am-11:30 am

## 2016-10-06 NOTE — Discharge Summary (Signed)
Physician Discharge Summary  Patient ID: Jody Clark MRN: [id number] DOB/AGE: 1991/06/28 26 y.o.  Admit date: 10/05/2016 Discharge date: 10/06/2016  Admission Diagnoses:  Possible appendicitis  Discharge Diagnoses:  Acute appendicitis without peritonitis  Active Problems:   Abdominal pain   PROCEDURES:  laparoscopic appendectomy 10/05/16, Dr[facility]   Hospital Course: Patient is a 26 year old female with a past history of acid reflux who [facility] with complaints of abdominal pain that began yesterday. Patient states yesterday morning she had progressively worsening lower back pain that moved into her right lower quadrant. The pain was constant, non radiating, severe. Associated 1 episode of vomiting, 4-5 episodes of watery diarrhea, subjective fevers, chills, generalized body aches, intermittent dry cough. She took Tylenol with little relief. Patient denies chest pain, shortness of breath, blood in her vomit, blood in her stools, abnormal vaginal discharge, dysuria, hematuria. Labs: Lactic acid 2.1, potassium 3, total bilirubin 2.2, febrile 101.3, all other labs unremarkable CT abdomen and pelvis: Appendix measuring up to 8 mm in diameter, no secondary signs of appendicitis. Findings are indeterminate for acute appendicitis.  Pt was seen and admitted by Dr. Luisa Hartornett. She was taken to the OR later that day and underwent appendectomy by Dr. Luisa Hartornett.  The following AM pt was doing well and wanted to go home.  She was tolerating diet and discharged home at that time.  Condition on d/C:  Improved    I did not see the patient and records are from the chart.    CBC Latest Ref Rng & Units 10/06/2016 10/05/2016 10/05/2016  WBC 4.0 - 10.5 K/uL 3.7(L) 5.4 6.2  Hemoglobin 12.0 - 15.0 g/dL 11.7(L) 11.9(L) 15.2(H)  Hematocrit 36.0 - 46.0 % 33.1(L) 33.3(L) 42.4  Platelets 150 - 400 K/uL 231 229 270    CMP Latest Ref Rng & Units 10/06/2016 10/05/2016 06/11/2015  Glucose 65 - 99 mg/dL  782(N112(H) 95 562(Z100(H)  BUN 6 - 20 mg/dL <3(Y<5(L) 10 5(L)  Creatinine 0.44 - 1.00 mg/dL 8.650.84 7.840.97 6.960.92  Sodium 135 - 145 mmol/L 138 137 135  Potassium 3.5 - 5.1 mmol/L 3.7 3.0(L) 4.0  Chloride 101 - 111 mmol/L 105 102 99(L)  CO2 22 - 32 mmol/L 24 22 28   Calcium 8.9 - 10.3 mg/dL 2.9(B8.6(L) 9.1 9.5  Total Protein 6.5 - 8.1 g/dL - 7.7 -  Total Bilirubin 0.3 - 1.2 mg/dL - 2.2(H) -  Alkaline Phos 38 - 126 U/L - 77 -  AST 15 - 41 U/L - 25 -  ALT 14 - 54 U/L - 17 -    Condition on d/c:  Improved   Disposition: 01-Home or Self Care   Allergies as of 10/06/2016   No Known Allergies     Medication List    TAKE these medications   oxyCODONE-acetaminophen 5-325 MG tablet Commonly known as:  PERCOCET/ROXICET Take 1 tablet by mouth every 4 (four) hours as needed for severe pain.      Follow-up Information    [facility], [place]. Schedule an appointment as soon as possible for a visit in 2 week(s).   Specialty:  General Surgery Why:  for follow Contact information: [address] [place] [id number] [phone]          Signed: Sherrie GeorgeJENNINGS,Cuthbert Turton 10/06/2016, 2:14 PM

## 2017-03-29 ENCOUNTER — Encounter (HOSPITAL_COMMUNITY): Payer: Self-pay | Admitting: Emergency Medicine

## 2017-03-29 ENCOUNTER — Emergency Department (HOSPITAL_COMMUNITY)
Admission: EM | Admit: 2017-03-29 | Discharge: 2017-03-29 | Disposition: A | Discharge status: Home / Self Care | Payer: BLUE CROSS/BLUE SHIELD | Attending: Emergency Medicine | Admitting: Emergency Medicine

## 2017-03-29 DIAGNOSIS — J029 Acute pharyngitis, unspecified: Secondary | ICD-10-CM | POA: Diagnosis not present

## 2017-03-29 LAB — RAPID STREP SCREEN (MED CTR MEBANE ONLY): STREPTOCOCCUS, GROUP A SCREEN (DIRECT): NEGATIVE

## 2017-03-29 MED ORDER — NAPROXEN 500 MG PO TABS: 500.0000 mg | ORAL_TABLET | Freq: Two times a day (BID) | ORAL | 0 refills | Status: DC

## 2017-03-29 NOTE — ED Notes (Signed)
Pt states she understands instruction. Home stable with steady gait. 

## 2017-03-29 NOTE — ED Provider Notes (Signed)
[facility]-EMERGENCY DEPT Provider Note   CSN: [id number] Arrival date & time: 03/29/17  [id number]     History   Chief Complaint Chief Complaint  Patient presents with  . Sore Throat    HPI Jody Clark is a 26 y.o. female.  Patient presents with complaint of sore throat becoming worse yesterday. Patient has also had nasal congestion and runny nose with sneezing. No ear pain, cough, fevers, vomiting. No treatments prior to arrival. 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 Patient has pain with swallowing but is able to swallow. The onset of this condition was acute. The course is constant. Aggravating factors: swallowing. Alleviating factors: none.        Past Medical History:  Diagnosis Date  . Acid reflux     Patient Active Problem List   Diagnosis Date Noted  . Abdominal pain 10/05/2016    Past Surgical History:  Procedure Laterality Date  . APPENDECTOMY  10/05/2016   lap appy  . LAPAROSCOPIC APPENDECTOMY N/A 10/05/2016   Procedure: APPENDECTOMY LAPAROSCOPIC;  Surgeon: Harriette Bouillonhomas Cornett, MD;  Location: [facility] OR;  Service: General;  Laterality: N/A;    OB History    No data available       Home Medications    Prior to Admission medications   Medication Sig Start Date End Date Taking? Authorizing Provider  naproxen (NAPROSYN) 500 MG tablet Take 1 tablet (500 mg total) by mouth 2 (two) times daily. 03/29/17   Renne CriglerGeiple, Londa Mackowski, PA-C  oxyCODONE-acetaminophen (PERCOCET/ROXICET) 5-325 MG tablet Take 1 tablet by mouth every 4 (four) hours as needed for severe pain. 10/06/16   Jerre SimonFocht, Jessica L, PA    Family History No family history on file.  Social History Social History  Substance Use Topics  . Smoking status: Never Smoker  . Smokeless tobacco: Never Used  . Alcohol use 0.6 oz/week    1 Cans of beer per week     Allergies   Patient has no known allergies.   Review of Systems Review of Systems  Constitutional: Negative for chills, fatigue and fever.  HENT: Positive for  congestion, rhinorrhea, sneezing and sore throat. Negative for ear pain and sinus pressure.   Eyes: Negative for redness.  Respiratory: Negative for cough and wheezing.   Gastrointestinal: Negative for abdominal pain, diarrhea, nausea and vomiting.  Genitourinary: Negative for dysuria.  Musculoskeletal: Negative for myalgias and neck stiffness.  Skin: Negative for rash.  Neurological: Negative for headaches.  Hematological: Negative for adenopathy.     Physical Exam Updated Vital Signs BP (!) 139/96   Pulse 76   Temp 98 F (36.7 C) (Oral)   Resp 17   LMP 02/22/2017 (Approximate)   SpO2 100%   Physical Exam  Constitutional: She appears well-developed and well-nourished.  HENT:  Head: Normocephalic and atraumatic.  Right Ear: Tympanic membrane, external ear and ear canal normal.  Left Ear: Tympanic membrane, external ear and ear canal normal.  Nose: Nose normal. No mucosal edema or rhinorrhea.  Mouth/Throat: Uvula is midline, oropharynx is clear and moist and mucous membranes are normal. Mucous membranes are not dry. No oral lesions. No trismus in the jaw. No uvula swelling. No oropharyngeal exudate, posterior oropharyngeal edema, posterior oropharyngeal erythema or tonsillar abscesses.  Eyes: Conjunctivae are normal. Right eye exhibits no discharge. Left eye exhibits no discharge.  Neck: Normal range of motion. Neck supple.  Cardiovascular: Normal rate, regular rhythm and normal heart sounds.   Pulmonary/Chest: Effort normal and breath sounds normal. No respiratory distress. She has no wheezes. She  has no rales.  Abdominal: Soft. There is no tenderness.  Lymphadenopathy:    She has no cervical adenopathy.  Neurological: She is alert.  Skin: Skin is warm and dry.  Psychiatric: She has a normal mood and affect.  Nursing note and vitals reviewed.    ED Treatments / Results  Labs (all labs ordered are listed, but only abnormal results are displayed) Labs Reviewed  RAPID  STREP SCREEN (NOT AT [facility])  CULTURE, GROUP A STREP [facility] [facility])    EKG  EKG Interpretation None       Radiology No results found.  Procedures Procedures (including critical care time)  Medications Ordered in ED Medications - No data to display   Initial Impression / Assessment and Plan / ED Course  I have reviewed the triage vital signs and the nursing notes.  Pertinent labs & imaging results that were available during my care of the patient were reviewed by me and considered in my medical decision making (see chart for details).     Patient seen and examined. Informed patient of negative strep test.  Vital signs reviewed and are as follows: BP 105/64   Pulse 88   Temp 98 F (36.7 C) (Oral)   Resp 17   LMP 02/22/2017 (Approximate)   SpO2 99%   Counseled on NSAIDs for pain, antihistamines for nasal congestion and sneezing. Patient counseled on supportive care for viral URI and s/s to return including worsening symptoms, persistent fever, persistent vomiting, or if they have any other concerns. Urged to see PCP if symptoms persist for more than 3 days. Patient verbalizes understanding and agrees with plan.    Final Clinical Impressions(s) / ED Diagnoses   Final diagnoses:  Sore throat   Patient with sore throat, possible viral pharyngitis. Cannot rule out allergic etiology as well. Treatment as above. Strep test is negative. Patient appears well, nontoxic. Safe for discharge to home at this time with conservative measures. No signs of the space abscess or PTA.  New Prescriptions New Prescriptions   NAPROXEN (NAPROSYN) 500 MG TABLET    Take 1 tablet (500 mg total) by mouth 2 (two) times daily.     Renne CriglerGeiple, Netanel Yannuzzi, PA-C 03/29/17 [id number]    Pricilla LovelessGoldston, Scott, MD 03/29/17 [phone]

## 2017-03-29 NOTE — Discharge Instructions (Signed)
Please read and follow all provided instructions.  Your diagnoses today include:  1. Sore throat     Tests performed today include:  Strep test: was negative for strep throat  Strep culture: you will be notified if this comes back positive  Vital signs. See below for your results today.   Medications prescribed:   Naproxen - anti-inflammatory pain medication  Do not exceed 500mg  naproxen every 12 hours, take with food  You have been prescribed an anti-inflammatory medication or NSAID. Take with food. Take smallest effective dose for the shortest duration needed for your pain. Stop taking if you experience stomach pain or vomiting.   Home care instructions:  Please read the educational materials provided and follow any instructions contained in this packet.  Follow-up instructions: Please follow-up with your primary care provider as needed for further evaluation of your symptoms.  Return instructions:   Please return to the Emergency Department if you experience worsening symptoms.   Return if you have worsening problems swallowing, your neck becomes swollen, you cannot swallow your saliva or your voice becomes muffled.   Return with high persistent fever, persistent vomiting, or if you have trouble breathing.   Please return if you have any other emergent concerns.  Additional Information:  Your vital signs today were: BP (!) 139/96    Pulse 76    Temp 98 F (36.7 C) (Oral)    Resp 17    LMP 02/22/2017 (Approximate)    SpO2 100%  If your blood pressure (BP) was elevated above 135/85 this visit, please have this repeated by your doctor within one month. --------------

## 2017-03-29 NOTE — ED Triage Notes (Signed)
Pt reports sore throat since yesterday, denies cough or fever.

## 2017-03-31 LAB — CULTURE, GROUP A STREP (THRC)

## 2017-04-03 IMAGING — CT CT ABD-PELV W/ CM
2 of 4 series · 13 of 46 positions shown, 15 images · IV contrast (iopamidol)
Comparison: None.

CLINICAL DATA: 25 y/o  F; right lower quadrant abdominal pain.

EXAM:
CT ABDOMEN AND PELVIS WITH CONTRAST
TECHNIQUE: Multidetector CT imaging of the abdomen and pelvis was performed
using the standard protocol following bolus administration of
intravenous contrast.
CONTRAST:  100mL QEF9B0-IEE IOPAMIDOL (QEF9B0-IEE) INJECTION 61%

[Series 201: routine, idose (2) · axial · 0.65mm/px · z∈[-242,+98]mm · 10 of 82 slices shown, 12 images]
[im 7/82  soft-tissue]
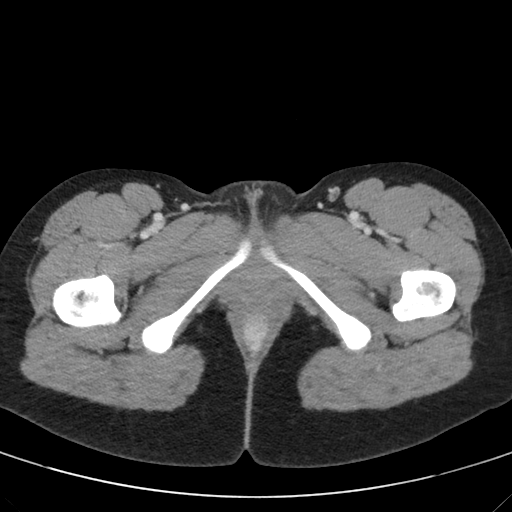
[im 7/82  bone]
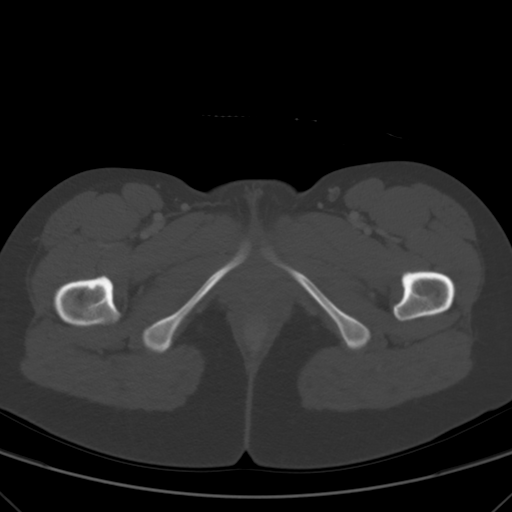
[im 13/82  soft-tissue]
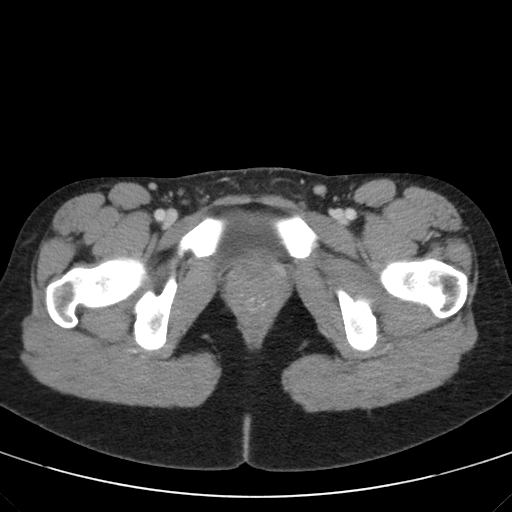
[im 23/82  soft-tissue]
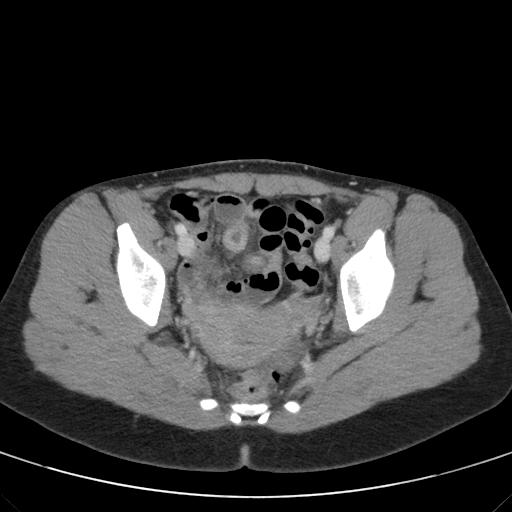
[im 30/82  soft-tissue]
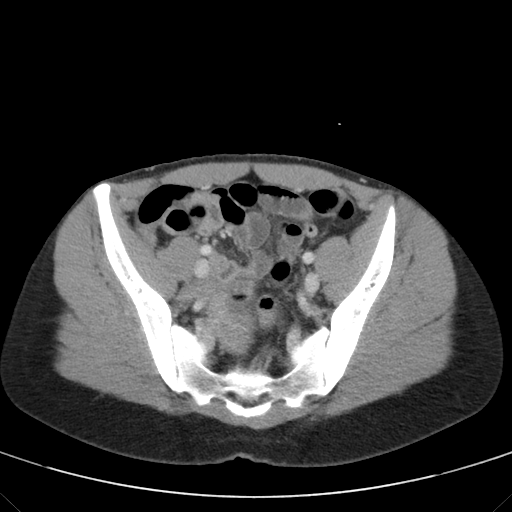
[im 36/82  soft-tissue]
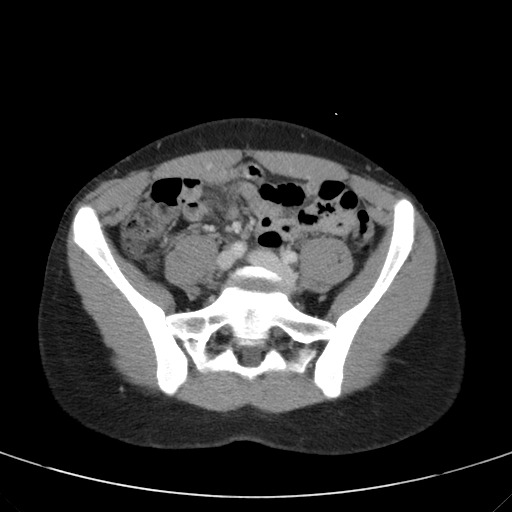
[im 46/82  soft-tissue]
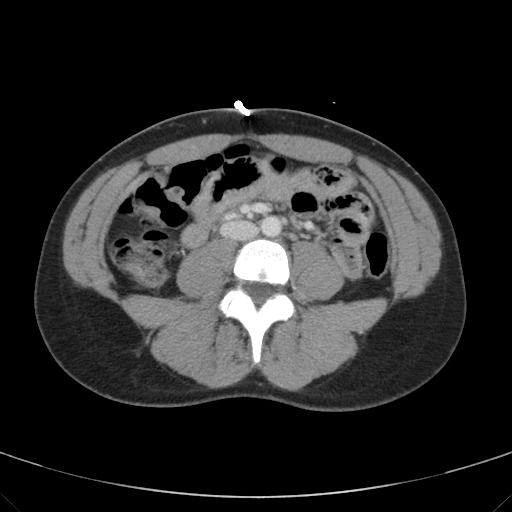
[im 52/82  soft-tissue]
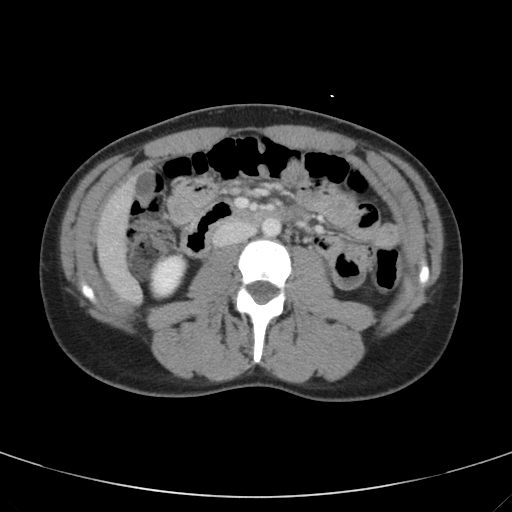
[im 62/82  soft-tissue]
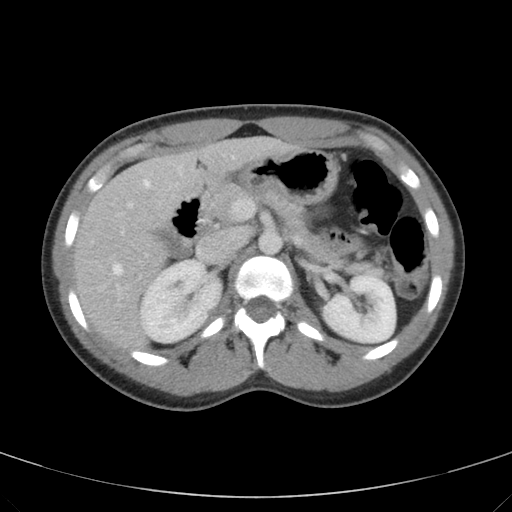
[im 69/82  soft-tissue]
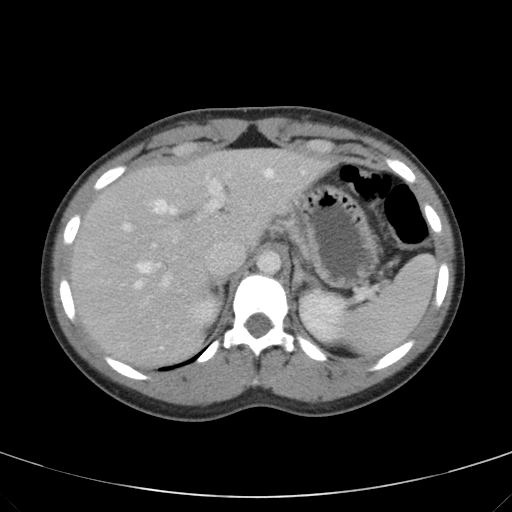
[im 69/82  bone]
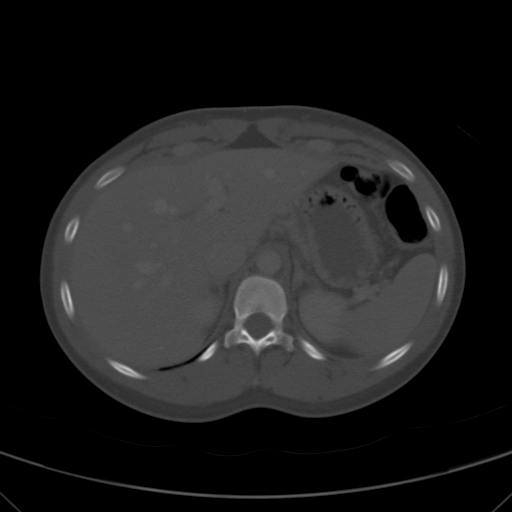
[im 75/82  soft-tissue]
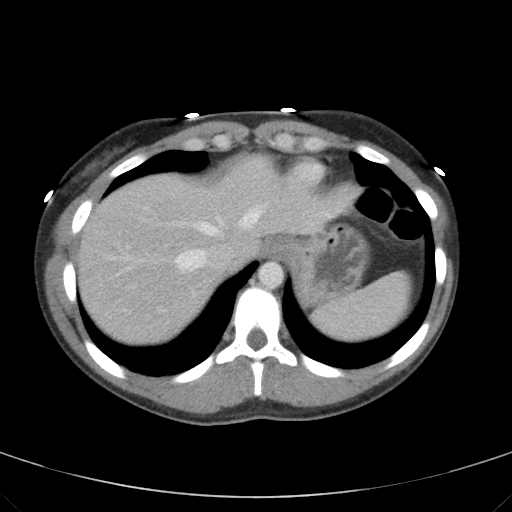

[Series 203: coronals, idose (2) · coronal · 0.45mm/px · 3 of 108 slices shown]
[im 36/108  soft-tissue]
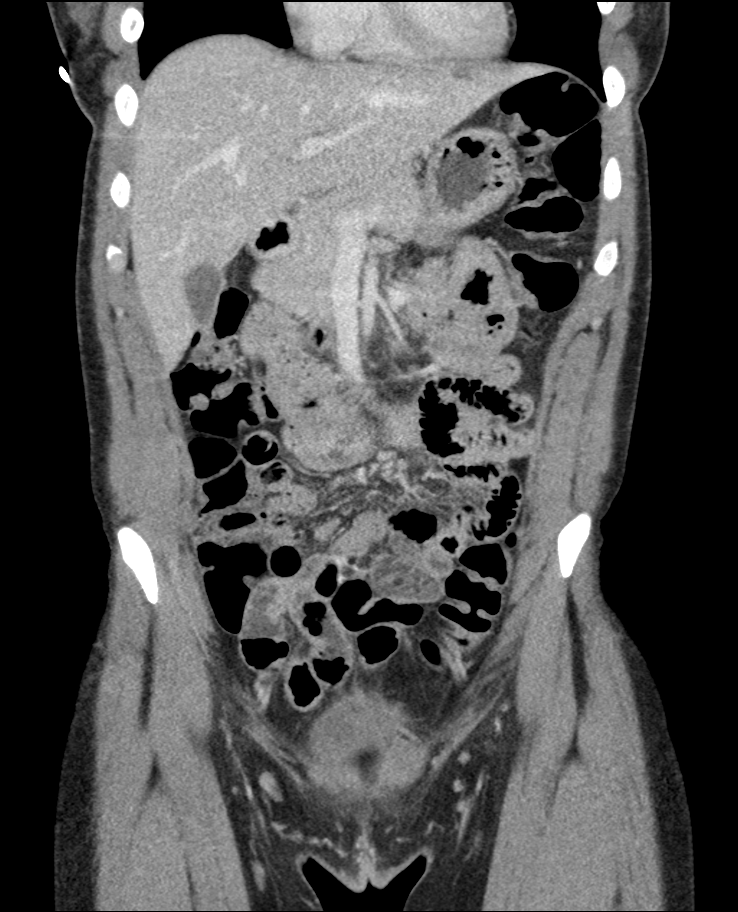
[im 48/108  soft-tissue]
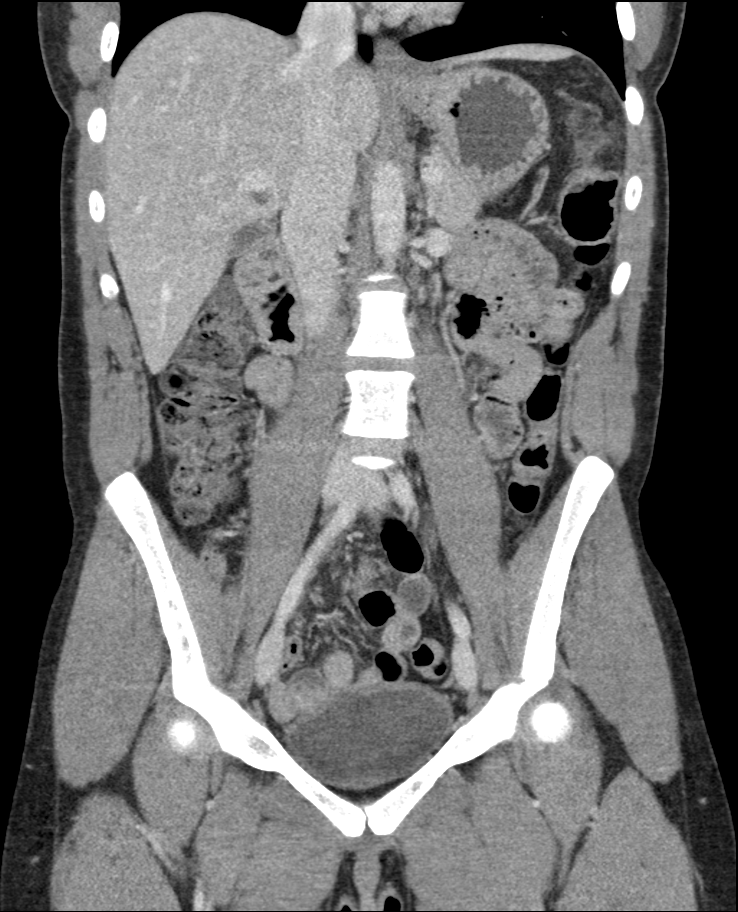
[im 60/108  soft-tissue]
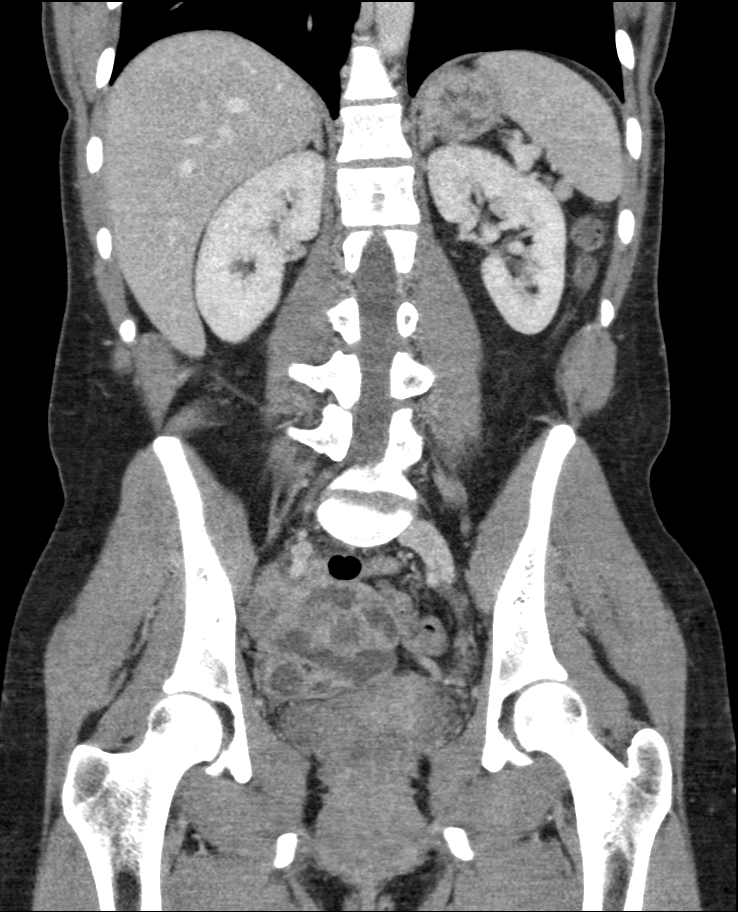

[13 of 46 positions shown; findings below may reference images not displayed]

FINDINGS: Lower chest: No acute abnormality.

Hepatobiliary: No focal liver abnormality is seen. No gallstones,
gallbladder wall thickening, or biliary dilatation.

Pancreas: Unremarkable. No pancreatic ductal dilatation or
surrounding inflammatory changes.

Spleen: Normal in size without focal abnormality.

Adrenals/Urinary Tract: Adrenal glands are unremarkable. Kidneys are
normal, without renal calculi, focal lesion, or hydronephrosis.
Bladder is unremarkable.

Stomach/Bowel: Stomach is within normal limits. Prominent size
appendix measuring up to 8 mm in diameter, no secondary signs of
appendicitis. No evidence of bowel wall thickening, distention, or
inflammatory changes.

Vascular/Lymphatic: No significant vascular findings are present. No
enlarged abdominal or pelvic lymph nodes.

Reproductive: Uterus and bilateral adnexa are unremarkable.

Other: No abdominal wall hernia or abnormality. No abdominopelvic
ascites.

Musculoskeletal: No acute or significant osseous findings.
IMPRESSION: The appendix measures up to 8 mm in diameter, no secondary signs of
appendicitis. Findings are indeterminate for acute appendicitis.
Otherwise unremarkable CT of abdomen and pelvis.

By: Sahithi Marz M.D.

## 2017-12-07 ENCOUNTER — Other Ambulatory Visit: Payer: Self-pay

## 2017-12-07 ENCOUNTER — Ambulatory Visit (INDEPENDENT_AMBULATORY_CARE_PROVIDER_SITE_OTHER): Payer: 59 | Admitting: Physician Assistant

## 2017-12-07 ENCOUNTER — Encounter: Payer: Self-pay | Admitting: Physician Assistant

## 2017-12-07 VITALS — BP 114/78 | HR 79 | Temp 99.0°F | Resp 16 | Ht 63.0 in | Wt 132.0 lb

## 2017-12-07 DIAGNOSIS — Z13220 Encounter for screening for lipoid disorders: Secondary | ICD-10-CM

## 2017-12-07 DIAGNOSIS — Z0000 Encounter for general adult medical examination without abnormal findings: Secondary | ICD-10-CM | POA: Diagnosis not present

## 2017-12-07 DIAGNOSIS — Z13228 Encounter for screening for other metabolic disorders: Secondary | ICD-10-CM | POA: Diagnosis not present

## 2017-12-07 DIAGNOSIS — Z1329 Encounter for screening for other suspected endocrine disorder: Secondary | ICD-10-CM | POA: Diagnosis not present

## 2017-12-07 DIAGNOSIS — Z01419 Encounter for gynecological examination (general) (routine) without abnormal findings: Secondary | ICD-10-CM

## 2017-12-07 DIAGNOSIS — Z130 Encounter for screening for diseases of the blood and blood-forming organs and certain disorders involving the immune mechanism: Secondary | ICD-10-CM

## 2017-12-07 DIAGNOSIS — N898 Other specified noninflammatory disorders of vagina: Secondary | ICD-10-CM | POA: Diagnosis not present

## 2017-12-07 DIAGNOSIS — K649 Unspecified hemorrhoids: Secondary | ICD-10-CM | POA: Diagnosis not present

## 2017-12-07 DIAGNOSIS — Z124 Encounter for screening for malignant neoplasm of cervix: Secondary | ICD-10-CM

## 2017-12-07 DIAGNOSIS — R350 Frequency of micturition: Secondary | ICD-10-CM

## 2017-12-07 DIAGNOSIS — Z113 Encounter for screening for infections with a predominantly sexual mode of transmission: Secondary | ICD-10-CM | POA: Diagnosis not present

## 2017-12-07 LAB — POCT URINALYSIS DIP (MANUAL ENTRY)
Bilirubin, UA: NEGATIVE
Glucose, UA: NEGATIVE mg/dL
Ketones, POC UA: NEGATIVE mg/dL
Leukocytes, UA: NEGATIVE
Nitrite, UA: NEGATIVE
Protein Ur, POC: NEGATIVE mg/dL
Spec Grav, UA: >=1.03 — AB (ref 1.010–1.025)
Urobilinogen, UA: 0.2 E.U./dL
pH, UA: 5.5 (ref 5.0–8.0)

## 2017-12-07 LAB — POCT WET + KOH PREP
Trich by wet prep: ABSENT
Yeast by KOH: ABSENT
Yeast by wet prep: ABSENT

## 2017-12-07 LAB — POC MICROSCOPIC URINALYSIS (UMFC): Mucus: ABSENT

## 2017-12-07 MED ORDER — HYDROCORTISONE 2.5 % RE CREA: 1.0000 "application " | TOPICAL_CREAM | Freq: Two times a day (BID) | RECTAL | 0 refills | Status: DC

## 2017-12-07 MED ORDER — HYDROCORTISONE ACETATE 25 MG RE SUPP: 25.0000 mg | Freq: Two times a day (BID) | RECTAL | 0 refills | Status: DC

## 2017-12-07 NOTE — Progress Notes (Signed)
Primary Care at [place], [place] [address] [id number] [phone]  Date:  12/07/2017   Name:  Jody Clark   DOB:  [birth year]   MRN:  [id number]  PCP:  System, Pcp Not In    Chief Complaint: Annual Exam (w/pap)   History of Present Illness:  This is a pleasant 27 y.o. female with PMH GERD who is presenting for CPE. 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 She is from [place], [place] - received care there. She is in nursing school.   Complaints: Hemorrhoids - recently used suppositories which helped a lot. Vaginal itching - recently treated for BV.  Urinary frequency and burning.   LMP: Mar 17. Regular.  Contraception: OCPs Last pap: unsure - a few years ago.  Sexual history: active with men only. One partner only.  Eye: 20/25 Diet/Exercise:  Gym 2x/week.  Fam hx: healthy family history is not on file. Tobacco/alcohol/substance use: never smoker. No drug use. Drinks 0.6 oz alcohol/week.   Review of Systems:  Review of Systems  Constitutional: Negative for chills, fatigue and fever.  Respiratory: Negative for cough, shortness of breath and wheezing.   Cardiovascular: Negative for chest pain and palpitations.  Gastrointestinal: Negative for abdominal pain, diarrhea, nausea and vomiting.  Genitourinary: Positive for dysuria, frequency and vaginal pain ("itching"). Negative for decreased urine volume, difficulty urinating, enuresis, flank pain, hematuria, urgency and vaginal discharge.  Musculoskeletal: Negative for back pain.  Neurological: Negative for dizziness, weakness, light-headedness and headaches.    Patient Active Problem List   Diagnosis Date Noted  . Abdominal pain 10/05/2016    Prior to Admission medications   Not on File    No Known Allergies  Past Surgical History:  Procedure Laterality Date  . APPENDECTOMY  10/05/2016   lap appy  . LAPAROSCOPIC APPENDECTOMY N/A 10/05/2016   Procedure: APPENDECTOMY LAPAROSCOPIC;  Surgeon: Erroll Luna, MD;   Location: [facility];  Service: General;  Laterality: N/A;    Social History   Tobacco Use  . Smoking status: Never Smoker  . Smokeless tobacco: Never Used  Substance Use Topics  . Alcohol use: Yes    Alcohol/week: 0.6 oz    Types: 1 Cans of beer per week  . Drug use: No    No family history on file.  Medication list has been reviewed and updated.  Physical Examination:  Physical Exam  Constitutional: She is oriented to person, place, and time. No distress.  Cardiovascular: Normal rate, regular rhythm and normal heart sounds.  Genitourinary: Vagina normal.    Pelvic exam was performed with patient supine. Cervix exhibits no motion tenderness, no discharge and no friability. Right adnexum displays no mass and no tenderness. Left adnexum displays no mass and no tenderness.  Neurological: She is alert and oriented to person, place, and time.  Skin: Skin is warm and dry.  Psychiatric: Judgment normal.  Vitals reviewed.  BP 114/78   Pulse 79   Temp 99 F (37.2 C) (Oral)   Resp 16   Ht _0  (1.6 m)   Wt 132 lb (59.9 kg)   LMP 11/13/2017   SpO2 97%   BMI 23.38 kg/m   Assessment and Plan: 1. Annual physical exam - Pt presents for annual exam. PAP done today, routine screening for STDs. Labs are pending, will contact with results. Health maintenance provided.  2. Screening for endocrine, metabolic and immunity disorder - CBC - CMP14+EGFR  3. Screening, lipid - Lipid panel  4. Cervical cancer screening  5. Encounter for gynecological examination without abnormal finding - Pap IG, CT/NG w/ reflex HPV when ASC-U  6. Screen for STD (sexually transmitted disease) - HIV antibody - RPR  7. Vagina itching - POCT Wet + KOH Prep  8. Urinary frequency - POCT Microscopic Urinalysis (UMFC) - POCT urinalysis dipstick - Urine Culture   Mercer Pod, PA-C  [facility] at [place] 12/07/2017 10:13 AM

## 2017-12-07 NOTE — Patient Instructions (Addendum)
It was a pleasure meeting you. See below for tips we talked about.  We will contact you with the results of your labs when they come back.   For constipation  Look up "6 Reasons to Care about [place]" at [place] (WindowBlog.ch)  1) Water: Make sure you are drinking enough water daily - about 1-3 liters. 2) Fiber: Make sure you are getting enough fiber in your diet - this will make you regular - you can eat high fiber foods or use metamucil as a supplement - it is really important to drink enough water when using fiber supplements. Foods that have a lot of fiber include vegetables, fruits, beans, nuts, oatmeal, and some breads and cereals. You can tell how much fiber is in a food by reading the nutrition label. Doctors recommend eating 25 to 36 grams of fiber each day. 3) Fitness: Increasing your physical activity will help increase the natural movement of your bowels. Try to get 20-30 minutes of exercise daily.  4) Google "squatty potty". (you don't have to buy it). Use a 9" stool in front of your toilet to rest your feet on while you have a bowel movement. This will loosen your rectal muscles to help your stool come out easier and prevent straining.   (Sometimes probiotics help)  If your stools are hard or are formed balls or you have to strain a stool softener will help - use colace 2-3 capsule a day  Option 1) For gentle treatment of constipation Use Miralax 1-2 capfuls a day until your stools are soft and regular and then decrease the usage - you can use this daily  ----------------------------------------------- DR. PENUMALLI'S GUIDE TO HAPPY AND HEALTHY LIVING These are some of my general health and wellness recommendations. Some of them may apply to you better than others. Please use common sense as you try these suggestions and feel free to ask me any questions.  ACTIVITY/FITNESS Mental, social, emotional and physical stimulation are  very important for brain and body health. Try learning a new activity (arts, music, language, sports, games).  Keep moving your body to the best of your abilities. You can do this at home, inside or outside, the park, community center, gym or anywhere you like. Consider a physical therapist or personal trainer to get started. Consider the app Sworkit. Fitness trackers such as smart-watches, smart-phones or Fitbits can help as well.  NUTRITION Eat more plants: colorful vegetables, nuts, seeds and berries. Eat less sugar, salt, preservatives and processed foods. Avoid toxins such as cigarettes and alcohol. Drink water when you are thirsty. Warm water with a slice of lemon is an excellent morning drink to start the day.  Consider these websites for more information The Nutrition Source (https://www.henry-hernandez.biz/) Precision Nutrition (WindowBlog.ch)  RELAXATION Consider practicing mindfulness meditation or other relaxation techniques such as deep breathing, prayer, yoga, tai chi, massage. See website mindful.org or the apps Headspace or Calm to help get started.  SLEEP Try to get at least 7-8+ hours sleep per day. Regular exercise and reduced caffeine will help you sleep better. Practice good sleep hygeine techniques. See website sleep.org for more information.  ----------------------------------------------------  Health Maintenance, Female Adopting a healthy lifestyle and getting preventive care can go a long way to promote health and wellness. Talk with your health care provider about what schedule of regular examinations is right for you. This is a good chance for you to check in with your provider about disease prevention and staying healthy. In between  checkups, there are plenty of things you can do on your own. Experts have done a lot of research about which lifestyle changes and preventive measures are most likely to keep you healthy.  Ask your health care provider for more information. Weight and diet Eat a healthy diet  Be sure to include plenty of vegetables, fruits, low-fat dairy products, and lean protein.  Do not eat a lot of foods high in solid fats, added sugars, or salt.  Get regular exercise. This is one of the most important things you can do for your health. ? Most adults should exercise for at least 150 minutes each week. The exercise should increase your heart rate and make you sweat (moderate-intensity exercise). ? Most adults should also do strengthening exercises at least twice a week. This is in addition to the moderate-intensity exercise.  Maintain a healthy weight  Body mass index (BMI) is a measurement that can be used to identify possible weight problems. It estimates body fat based on height and weight. Your health care provider can help determine your BMI and help you achieve or maintain a healthy weight.  For females 74 years of age and older: ? A BMI below 18.5 is considered underweight. ? A BMI of 18.5 to 24.9 is normal. ? A BMI of 25 to 29.9 is considered overweight. ? A BMI of 30 and above is considered obese.  Watch levels of cholesterol and blood lipids  You should start having your blood tested for lipids and cholesterol at 26 years of age, then have this test every 5 years.  You may need to have your cholesterol levels checked more often if: ? Your lipid or cholesterol levels are high. ? You are older than 26 years of age. ? You are at high risk for heart disease.  Cancer screening Breast Cancer  Practice breast self-awareness. This means understanding how your breasts normally appear and feel.  It also means doing regular breast self-exams. Let your health care provider know about any changes, no matter how small.  If you are in your 20s or 30s, you should have a clinical breast exam (CBE) by a health care provider every 1-3 years as part of a regular health exam.  If you  are 37 or older, have a CBE every year. Also consider having a breast X-ray (mammogram) every year.  If you have a family history of breast cancer, talk to your health care provider about genetic screening.  If you are at high risk for breast cancer, talk to your health care provider about having an MRI and a mammogram every year.  Breast cancer gene (BRCA) assessment is recommended for women who have family members with BRCA-related cancers. BRCA-related cancers include: ? Breast. ? Ovarian. ? Tubal. ? Peritoneal cancers.  Results of the assessment will determine the need for genetic counseling and BRCA1 and BRCA2 testing.  Cervical Cancer Your health care provider may recommend that you be screened regularly for cancer of the pelvic organs (ovaries, uterus, and vagina). This screening involves a pelvic examination, including checking for microscopic changes to the surface of your cervix (Pap test). You may be encouraged to have this screening done every 3 years, beginning at age 11.  For women ages 26-65, health care providers may recommend pelvic exams and Pap testing every 3 years, or they may recommend the Pap and pelvic exam, combined with testing for human papilloma virus (HPV), every 5 years. Some types of HPV increase your risk of cervical  cancer. Testing for HPV may also be done on women of any age with unclear Pap test results.  Other health care providers may not recommend any screening for nonpregnant women who are considered low risk for pelvic cancer and who do not have symptoms. Ask your health care provider if a screening pelvic exam is right for you.  If you have had past treatment for cervical cancer or a condition that could lead to cancer, you need Pap tests and screening for cancer for at least 20 years after your treatment. If Pap tests have been discontinued, your risk factors (such as having a new sexual partner) need to be reassessed to determine if screening should  resume. Some women have medical problems that increase the chance of getting cervical cancer. In these cases, your health care provider may recommend more frequent screening and Pap tests.  Colorectal Cancer  This type of cancer can be detected and often prevented.  Routine colorectal cancer screening usually begins at 26 years of age and continues through 26 years of age.  Your health care provider may recommend screening at an earlier age if you have risk factors for colon cancer.  Your health care provider may also recommend using home test kits to check for hidden blood in the stool.  A small camera at the end of a tube can be used to examine your colon directly (sigmoidoscopy or colonoscopy). This is done to check for the earliest forms of colorectal cancer.  Routine screening usually begins at age 46.  Direct examination of the colon should be repeated every 5-10 years through 26 years of age. However, you may need to be screened more often if early forms of precancerous polyps or small growths are found.  Skin Cancer  Check your skin from head to toe regularly.  Tell your health care provider about any new moles or changes in moles, especially if there is a change in a mole's shape or color.  Also tell your health care provider if you have a mole that is larger than the size of a pencil eraser.  Always use sunscreen. Apply sunscreen liberally and repeatedly throughout the day.  Protect yourself by wearing long sleeves, pants, a wide-brimmed hat, and sunglasses whenever you are outside.  Heart disease, diabetes, and high blood pressure  High blood pressure causes heart disease and increases the risk of stroke. High blood pressure is more likely to develop in: ? People who have blood pressure in the high end of the normal range (130-139/85-89 mm Hg). ? People who are overweight or obese. ? People who are African American.  If you are 25-10 years of age, have your blood  pressure checked every 3-5 years. If you are 26 years of age or older, have your blood pressure checked every year. You should have your blood pressure measured twice-once when you are at a hospital or clinic, and once when you are not at a hospital or clinic. Record the average of the two measurements. To check your blood pressure when you are not at a hospital or clinic, you can use: ? An automated blood pressure machine at a pharmacy. ? A home blood pressure monitor.  If you are between 55 years and 79 years old, ask your health care provider if you should take aspirin to prevent strokes.  Have regular diabetes screenings. This involves taking a blood sample to check your fasting blood sugar level. ? If you are at a normal weight and have a  low risk for diabetes, have this test once every three years after 26 years of age. ? If you are overweight and have a high risk for diabetes, consider being tested at a younger age or more often. Preventing infection Hepatitis B  If you have a higher risk for hepatitis B, you should be screened for this virus. You are considered at high risk for hepatitis B if: ? You were born in a country where hepatitis B is common. Ask your health care provider which countries are considered high risk. ? Your parents were born in a high-risk country, and you have not been immunized against hepatitis B (hepatitis B vaccine). ? You have HIV or AIDS. ? You use needles to inject street drugs. ? You live with someone who has hepatitis B. ? You have had sex with someone who has hepatitis B. ? You get hemodialysis treatment. ? You take certain medicines for conditions, including cancer, organ transplantation, and autoimmune conditions.  Hepatitis C  Blood testing is recommended for: ? Everyone born from [birth year] through 1964. ? Anyone with known risk factors for hepatitis C.  Sexually transmitted infections (STIs)  You should be screened for sexually transmitted  infections (STIs) including gonorrhea and chlamydia if: ? You are sexually active and are younger than 26 years of age. ? You are older than 26 years of age and your health care provider tells you that you are at risk for this type of infection. ? Your sexual activity has changed since you were last screened and you are at an increased risk for chlamydia or gonorrhea. Ask your health care provider if you are at risk.  If you do not have HIV, but are at risk, it may be recommended that you take a prescription medicine daily to prevent HIV infection. This is called pre-exposure prophylaxis (PrEP). You are considered at risk if: ? You are sexually active and do not regularly use condoms or know the HIV status of your partner(s). ? You take drugs by injection. ? You are sexually active with a partner who has HIV.  Talk with your health care provider about whether you are at high risk of being infected with HIV. If you choose to begin PrEP, you should first be tested for HIV. You should then be tested every 3 months for as long as you are taking PrEP. Pregnancy  If you are premenopausal and you may become pregnant, ask your health care provider about preconception counseling.  If you may become pregnant, take 400 to 800 micrograms (mcg) of folic acid every day.  If you want to prevent pregnancy, talk to your health care provider about birth control (contraception). Osteoporosis and menopause  Osteoporosis is a disease in which the bones lose minerals and strength with aging. This can result in serious bone fractures. Your risk for osteoporosis can be identified using a bone density scan.  If you are 48 years of age or older, or if you are at risk for osteoporosis and fractures, ask your health care provider if you should be screened.  Ask your health care provider whether you should take a calcium or vitamin D supplement to lower your risk for osteoporosis.  Menopause may have certain physical  symptoms and risks.  Hormone replacement therapy may reduce some of these symptoms and risks. Talk to your health care provider about whether hormone replacement therapy is right for you. Follow these instructions at home:  Schedule regular health, dental, and eye exams.  Stay current  with your immunizations.  Do not use any tobacco products including cigarettes, chewing tobacco, or electronic cigarettes.  If you are pregnant, do not drink alcohol.  If you are breastfeeding, limit how much and how often you drink alcohol.  Limit alcohol intake to no more than 1 drink per day for nonpregnant women. One drink equals 12 ounces of beer, 5 ounces of wine, or 1 ounces of hard liquor.  Do not use street drugs.  Do not share needles.  Ask your health care provider for help if you need support or information about quitting drugs.  Tell your health care provider if you often feel depressed.  Tell your health care provider if you have ever been abused or do not feel safe at home. This information is not intended to replace advice given to you by your health care provider. Make sure you discuss any questions you have with your health care provider. Document Released: 03/01/2011 Document Revised: 01/22/2016 Document Reviewed: 05/20/2015 Elsevier Interactive Patient Education  2018 [employer].   IF you received an x-ray today, you will receive an invoice from [facility]. Please contact [facility] at [phone] with questions or concerns regarding your invoice.   IF you received labwork today, you will receive an invoice from [facility]. Please contact [facility] at [phone] with questions or concerns regarding your invoice.   Our billing staff will not be able to assist you with questions regarding bills from these companies.  You will be contacted with the lab results as soon as they are available. The fastest way to get your results is to activate your My Chart  account. Instructions are located on the last page of this paperwork. If you have not heard from [place] regarding the results in 2 weeks, please contact this office.

## 2017-12-08 LAB — CBC
Hematocrit: 42.4 % (ref 34.0–46.6)
Hemoglobin: 14.9 g/dL (ref 11.1–15.9)
MCH: 30.4 pg (ref 26.6–33.0)
MCHC: 35.1 g/dL (ref 31.5–35.7)
MCV: 87 fL (ref 79–97)
Platelets: 309 10*3/uL (ref 150–379)
RBC: 4.9 x10E6/uL (ref 3.77–5.28)
RDW: 13.6 % (ref 12.3–15.4)
WBC: 4.3 10*3/uL (ref 3.4–10.8)

## 2017-12-08 LAB — LIPID PANEL
Chol/HDL Ratio: 2.8 ratio (ref 0.0–4.4)
Cholesterol, Total: 150 mg/dL (ref 100–199)
HDL: 54 mg/dL (ref >39)
LDL Calculated: 84 mg/dL (ref 0–99)
Triglycerides: 59 mg/dL (ref 0–149)
VLDL Cholesterol Cal: 12 mg/dL (ref 5–40)

## 2017-12-08 LAB — CMP14+EGFR
ALT: 16 IU/L (ref 0–32)
AST: 18 IU/L (ref 0–40)
Albumin/Globulin Ratio: 1.7 (ref 1.2–2.2)
Albumin: 4.3 g/dL (ref 3.5–5.5)
Alkaline Phosphatase: 67 IU/L (ref 39–117)
BUN/Creatinine Ratio: 8 — ABNORMAL LOW (ref 9–23)
BUN: 7 mg/dL (ref 6–20)
Bilirubin Total: 0.4 mg/dL (ref 0.0–1.2)
CO2: 23 mmol/L (ref 20–29)
Calcium: 9.6 mg/dL (ref 8.7–10.2)
Chloride: 105 mmol/L (ref 96–106)
Creatinine, Ser: 0.93 mg/dL (ref 0.57–1.00)
GFR calc Af Amer: 98 mL/min/{1.73_m2} (ref >59)
GFR calc non Af Amer: 85 mL/min/{1.73_m2} (ref >59)
Globulin, Total: 2.6 g/dL (ref 1.5–4.5)
Glucose: 94 mg/dL (ref 65–99)
Potassium: 3.9 mmol/L (ref 3.5–5.2)
Sodium: 140 mmol/L (ref 134–144)
Total Protein: 6.9 g/dL (ref 6.0–8.5)

## 2017-12-08 LAB — RPR: RPR Ser Ql: NONREACTIVE

## 2017-12-08 LAB — URINE CULTURE

## 2017-12-08 LAB — HIV ANTIBODY (ROUTINE TESTING W REFLEX): HIV Screen 4th Generation wRfx: NONREACTIVE

## 2017-12-10 LAB — PAP IG, CT-NG, RFX HPV ASCU
Chlamydia, Nuc. Acid Amp: NEGATIVE
Gonococcus by Nucleic Acid Amp: NEGATIVE
PAP Smear Comment: 0

## 2017-12-16 ENCOUNTER — Telehealth: Payer: Self-pay | Admitting: Physician Assistant

## 2017-12-16 NOTE — Telephone Encounter (Signed)
Pt called. Scheduled OV with Casimiro NeedleMichael.

## 2017-12-16 NOTE — Telephone Encounter (Signed)
Pt came in stating that she is out of her birth control medication Jody Clark([place]). Pt prefers [facility] on [address]

## 2017-12-17 ENCOUNTER — Other Ambulatory Visit: Payer: Self-pay

## 2017-12-17 ENCOUNTER — Ambulatory Visit (INDEPENDENT_AMBULATORY_CARE_PROVIDER_SITE_OTHER): Payer: 59 | Admitting: Physician Assistant

## 2017-12-17 ENCOUNTER — Encounter: Payer: Self-pay | Admitting: Physician Assistant

## 2017-12-17 VITALS — BP 94/68 | HR 79 | Temp 98.1°F | Resp 16 | Ht 62.25 in | Wt 131.6 lb

## 2017-12-17 DIAGNOSIS — N920 Excessive and frequent menstruation with regular cycle: Secondary | ICD-10-CM

## 2017-12-17 MED ORDER — LEVONORGESTREL-ETHINYL ESTRAD 0.1-20 MG-MCG PO TABS: 1.0000 | ORAL_TABLET | Freq: Every day | ORAL | 4 refills | Status: DC

## 2017-12-17 NOTE — Patient Instructions (Addendum)
Don't inhale any form of smoke into your lungs while taking OCP.     IF you received an x-ray today, you will receive an invoice from [facility]. Please contact [facility] at [phone] with questions or concerns regarding your invoice.   IF you received labwork today, you will receive an invoice from [facility]. Please contact [facility] at [phone] with questions or concerns regarding your invoice.   Our billing staff will not be able to assist you with questions regarding bills from these companies.  You will be contacted with the lab results as soon as they are available. The fastest way to get your results is to activate your My Chart account. Instructions are located on the last page of this paperwork. If you have not heard from us regarding the results in 2 weeks, please contact this office.

## 2017-12-17 NOTE — Progress Notes (Signed)
    12/17/2017 9:02 AM   DOB: 1990/10/28 / MRN: [id number]  SUBJECTIVE:  Jody Clark is a 27 y.o. female presenting for refill of oral contraception.  Patient started this some years ago secondary to heavy menstrual periods along with excessive cramping during menstruation.  Patient states this is worked well for her and would like to continue the medication today.  She came off of her most recent cycle yesterday and is up for refill.  She recently had a Pap along with a physical and everything was within normal limits.  She tells me that she does not smoke.  She has No Known Allergies.   She  has a past medical history of Acid reflux.    She  reports that she has never smoked. She has never used smokeless tobacco. She reports that she drinks about 0.6 oz of alcohol per week. She reports that she does not use drugs. She  reports that she currently engages in sexual activity. The patient  has a past surgical history that includes Appendectomy (10/05/2016) and laparoscopic appendectomy (N/A, 10/05/2016).  Her family history is not on file.  Review of Systems  Constitutional: Negative for chills, diaphoresis and fever.  Gastrointestinal: Negative for nausea.  Skin: Negative for rash.  Neurological: Negative for dizziness.    The problem list and medications were reviewed and updated by myself where necessary and exist elsewhere in the encounter.   OBJECTIVE:  BP 94/68 (BP Location: Right Arm)   Pulse 79   Temp 98.1 F (36.7 C) (Oral)   Resp 16   Ht 5' 2.25" (1.581 m)   Wt 131 lb 9.6 oz (59.7 kg)   LMP 12/16/2017   SpO2 98%   BMI 23.88 kg/m   Physical Exam  Constitutional: She is oriented to person, place, and time. She appears well-nourished. No distress.  Eyes: Pupils are equal, round, and reactive to light. EOM are normal.  Cardiovascular: Normal rate.  Pulmonary/Chest: Effort normal.  Abdominal: She exhibits no distension.  Neurological: She is alert and oriented to  person, place, and time. No cranial nerve deficit. Gait normal.  Skin: Skin is dry. She is not diaphoretic.  Psychiatric: She has a normal mood and affect.  Vitals reviewed.   No results found for this or any previous visit (from the past 72 hour(s)).  No results found.  ASSESSMENT AND PLAN:  Karel Jarvisbony was seen today for medication refill.  Diagnoses and all orders for this visit:  Menorrhagia with regular cycle -     levonorgestrel-ethinyl estradiol (AVIANE,ALESSE,LESSINA) 0.1-20 MG-MCG tablet; Take 1 tablet by mouth daily.    The patient is advised to call or return to clinic if she does not see an improvement in symptoms, or to seek the care of the closest emergency department if she worsens with the above plan.   Deliah BostonMichael Tyriek Hofman, MHS, PA-C Primary Care at [facility] 12/17/2017 9:02 AM

## 2017-12-29 ENCOUNTER — Encounter: Payer: Self-pay | Admitting: Physician Assistant

## 2017-12-29 ENCOUNTER — Telehealth: Payer: BLUE CROSS/BLUE SHIELD | Admitting: Family

## 2017-12-29 DIAGNOSIS — B373 Candidiasis of vulva and vagina: Secondary | ICD-10-CM

## 2017-12-29 DIAGNOSIS — B3731 Acute candidiasis of vulva and vagina: Secondary | ICD-10-CM

## 2017-12-29 MED ORDER — FLUCONAZOLE 150 MG PO TABS: 150.0000 mg | ORAL_TABLET | Freq: Once | ORAL | 0 refills | Status: AC

## 2017-12-29 NOTE — Progress Notes (Signed)

## 2018-02-06 ENCOUNTER — Encounter: Payer: Self-pay | Admitting: Urgent Care

## 2018-02-06 ENCOUNTER — Ambulatory Visit (INDEPENDENT_AMBULATORY_CARE_PROVIDER_SITE_OTHER): Payer: 59 | Admitting: Urgent Care

## 2018-02-06 ENCOUNTER — Other Ambulatory Visit: Payer: Self-pay

## 2018-02-06 VITALS — BP 138/82 | HR 66 | Temp 98.0°F | Resp 16 | Ht 62.25 in | Wt 132.8 lb

## 2018-02-06 DIAGNOSIS — N898 Other specified noninflammatory disorders of vagina: Secondary | ICD-10-CM | POA: Diagnosis not present

## 2018-02-06 DIAGNOSIS — B3731 Acute candidiasis of vulva and vagina: Secondary | ICD-10-CM

## 2018-02-06 DIAGNOSIS — B373 Candidiasis of vulva and vagina: Secondary | ICD-10-CM

## 2018-02-06 LAB — POCT WET + KOH PREP: Trich by wet prep: ABSENT

## 2018-02-06 LAB — POCT URINE PREGNANCY: Preg Test, Ur: NEGATIVE

## 2018-02-06 MED ORDER — FLUCONAZOLE 150 MG PO TABS: 150.0000 mg | ORAL_TABLET | ORAL | 0 refills | Status: DC

## 2018-02-06 NOTE — Patient Instructions (Addendum)
Vaginal Yeast infection, Adult Vaginal yeast infection is a condition that causes soreness, swelling, and redness (inflammation) of the vagina. It also causes vaginal discharge. This is a common condition. Some women get this infection frequently. What are the causes? This condition is caused by a change in the normal balance of the yeast (candida) and bacteria that live in the vagina. This change causes an overgrowth of yeast, which causes the inflammation. What increases the risk? This condition is more likely to develop in:  Women who take antibiotic medicines.  Women who have diabetes.  Women who take birth control pills.  Women who are pregnant.  Women who douche often.  Women who have a weak defense (immune) system.  Women who have been taking steroid medicines for a long time.  Women who frequently wear tight clothing.  What are the signs or symptoms? Symptoms of this condition include:  White, thick vaginal discharge.  Swelling, itching, redness, and irritation of the vagina. The lips of the vagina (vulva) may be affected as well.  Pain or a burning feeling while urinating.  Pain during sex.  How is this diagnosed? This condition is diagnosed with a medical history and physical exam. This will include a pelvic exam. Your health care provider will examine a sample of your vaginal discharge under a microscope. Your health care provider may send this sample for testing to confirm the diagnosis. How is this treated? This condition is treated with medicine. Medicines may be over-the-counter or prescription. You may be told to use one or more of the following:  Medicine that is taken orally.  Medicine that is applied as a cream.  Medicine that is inserted directly into the vagina (suppository).  Follow these instructions at home:  Take or apply over-the-counter and prescription medicines only as told by your health care provider.  Do not have sex until your health  care provider has approved. Tell your sex partner that you have a yeast infection. That person should go to his or her health care provider if he or she develops symptoms.  Do not wear tight clothes, such as pantyhose or tight pants.  Avoid using tampons until your health care provider approves.  Eat more yogurt. This may help to keep your yeast infection from returning.  Try taking a sitz bath to help with discomfort. This is a warm water bath that is taken while you are sitting down. The water should only come up to your hips and should cover your buttocks. Do this 3-4 times per day or as told by your health care provider.  Do not douche.  Wear breathable, cotton underwear.  If you have diabetes, keep your blood sugar levels under control. Contact a health care provider if:  You have a fever.  Your symptoms go away and then return.  Your symptoms do not get better with treatment.  Your symptoms get worse.  You have new symptoms.  You develop blisters in or around your vagina.  You have blood coming from your vagina and it is not your menstrual period.  You develop pain in your abdomen. This information is not intended to replace advice given to you by your health care provider. Make sure you discuss any questions you have with your health care provider. Document Released: 05/26/2005 Document Revised: 01/28/2016 Document Reviewed: 02/17/2015 Elsevier Interactive Patient Education  2018 [employer] Safe Sex Practicing safe sex means taking steps before and during sex to reduce your risk of:  Getting an STD (  sexually transmitted disease).  Giving your partner an STD.  Unwanted pregnancy.  How can I practice safe sex?  To practice safe sex:  Limit your sexual partners to only one partner who is having sex with only you.  Avoid using alcohol and recreational drugs before having sex. These substances can affect your judgment.  Before having sex with a new  partner: ? Talk to your partner about past partners, past STDs, and drug use. ? You and your partner should be screened for STDs and discuss the results with each other.  Check your body regularly for sores, blisters, rashes, or unusual discharge. If you notice any of these problems, visit your health care provider.  If you have symptoms of an infection or you are being treated for an STD, avoid sexual contact.  While having sex, use a condom. Make sure to: ? Use a condom every time you have vaginal, oral, or anal sex. Both females and males should wear condoms during oral sex. ? Keep condoms in place from the beginning to the end of sexual activity. ? Use a latex condom, if possible. Latex condoms offer the best protection. ? Use only water-based lubricants or oils to lubricate a condom. Using petroleum-based lubricants or oils will weaken the condom and increase the chance that it will break.  See your health care provider for regular screenings, exams, and tests for STDs.  Talk with your health care provider about the form of birth control (contraception) that is best for you.  Get vaccinated against hepatitis B and human papillomavirus (HPV).  If you are at risk of being infected with HIV (human immunodeficiency virus), talk with your health care provider about taking a prescription medicine to prevent HIV infection. You are considered at risk for HIV if: ? You are a man who has sex with other men. ? You are a heterosexual man or woman who is sexually active with more than one partner. ? You take drugs by injection. ? You are sexually active with a partner who has HIV.  This information is not intended to replace advice given to you by your health care provider. Make sure you discuss any questions you have with your health care provider. Document Released: 09/23/2004 Document Revised: 12/31/2015 Document Reviewed: 07/06/2015 Elsevier Interactive Patient Education  2018 [employer].     IF you received an x-ray today, you will receive an invoice from [facility]. Please contact [facility] at [phone] with questions or concerns regarding your invoice.   IF you received labwork today, you will receive an invoice from [facility]. Please contact [facility] at [phone] with questions or concerns regarding your invoice.   Our billing staff will not be able to assist you with questions regarding bills from these companies.  You will be contacted with the lab results as soon as they are available. The fastest way to get your results is to activate your My Chart account. Instructions are located on the last page of this paperwork. If you have not heard from [place] regarding the results in 2 weeks, please contact this office.

## 2018-02-06 NOTE — Progress Notes (Signed)
   MRN: [id number] DOB: 15-Jul-1991  Subjective:   Jody Clark is a 27 y.o. female presenting for 2 day history of vaginal itching/irritation. Has painful area on the outer part of the vagina that she wants to have examined.  She has been using hydrocortisone over the area and for her itching.  Patient is sexually active and she uses condoms for protection only some of the time.  Denies fever, nausea, vomiting, belly pain, pelvic pain, dysuria, hematuria, urinary frequency.  Karel Jarvisbony has a current medication list which includes the following prescription(s): levonorgestrel-ethinyl estradiol. Also has No Known Allergies.  Karel Jarvisbony  has a past medical history of Acid reflux. Also  has a past surgical history that includes Appendectomy (10/05/2016) and laparoscopic appendectomy (N/A, 10/05/2016).  Objective:   Vitals: BP 138/82   Pulse 66   Temp 98 F (36.7 C) (Oral)   Resp 16   Ht 5' 2.25" (1.581 m)   Wt 132 lb 12.8 oz (60.2 kg)   SpO2 100%   BMI 24.09 kg/m   Physical Exam  Constitutional: She is oriented to person, place, and time. She appears well-developed and well-nourished.  Cardiovascular: Normal rate.  Pulmonary/Chest: Effort normal.  Genitourinary:     Neurological: She is alert and oriented to person, place, and time.  Skin: Skin is warm and dry.   Results for orders placed or performed in visit on 02/06/18 (from the past 24 hour(s))  POCT Wet + KOH Prep     Status: Abnormal   Collection Time: 02/06/18  9:35 AM  Result Value Ref Range   Yeast by KOH Present (A) Absent   Yeast by wet prep Present (A) Absent   WBC by wet prep Too numerous to count  (A) Few   Clue Cells Wet Prep HPF POC None None   Trich by wet prep Absent Absent   Bacteria Wet Prep HPF POC Few Few   Epithelial Cells By [employer]ell RubbermaidWet Pref ([facility]) Few None, Few, Too numerous to count   RBC,UR,HPF,POC None None RBC/hpf  POCT urine pregnancy     Status: None   Collection Time: 02/06/18 10:04 AM  Result Value Ref Range    Preg Test, Ur Negative Negative    Assessment and Plan :   Yeast vaginitis  Vaginal discharge - Plan: POCT Wet + KOH Prep, GC/Chlamydia Probe Amp, RPR, Trichomonas vaginalis, RNA, POCT urine pregnancy, CANCELED: HIV antibody  Vaginal sore - Plan: Herpes simplex virus culture  Labs pending, will cover for her yeast infection today with Diflucan.  Counseled patient against using any topical creams or vaginal products.  Recommended that she do her best to stay keep the area around the sore is clean and dry as possible.  Recommended she not use hot showers or hot baths, use gentle skin soaps.  We will follow-up with lab results  Wallis BambergMario Jodye Scali, PA-C [facility] [phone] 02/06/2018  9:31 AM

## 2018-02-07 LAB — RPR: RPR Ser Ql: NONREACTIVE

## 2018-02-08 LAB — GC/CHLAMYDIA PROBE AMP
Chlamydia trachomatis, NAA: NEGATIVE
Neisseria gonorrhoeae by PCR: NEGATIVE

## 2018-02-08 LAB — TRICHOMONAS VAGINALIS, PROBE AMP: TRICH VAG BY NAA: NEGATIVE

## 2018-02-08 LAB — HERPES SIMPLEX VIRUS CULTURE

## 2018-02-09 ENCOUNTER — Other Ambulatory Visit: Payer: Self-pay | Admitting: Urgent Care

## 2018-02-09 MED ORDER — VALACYCLOVIR HCL 1 G PO TABS: 1000.0000 mg | ORAL_TABLET | Freq: Three times a day (TID) | ORAL | 3 refills | Status: DC

## 2018-05-11 ENCOUNTER — Telehealth: Payer: BLUE CROSS/BLUE SHIELD | Admitting: Physician Assistant

## 2018-05-11 DIAGNOSIS — N760 Acute vaginitis: Secondary | ICD-10-CM

## 2018-05-11 MED ORDER — FLUCONAZOLE 150 MG PO TABS: ORAL_TABLET | ORAL | 0 refills | Status: DC

## 2018-05-11 NOTE — Progress Notes (Signed)
We are sorry that you are not feeling well. Here is how we plan to help! Based on what you shared with me it looks like you: May have a yeast vaginosis. Please also respond to my earlier message regarding the bacterial vaginosis in case this is going on as well.  Vaginosis is an inflammation of the vagina that can result in discharge, itching and pain. The cause is usually a change in the normal balance of vaginal bacteria or an infection. Vaginosis can also result from reduced estrogen levels after menopause.  The most common causes of vaginosis are:   Bacterial vaginosis which results from an overgrowth of one on several organisms that are normally present in your vagina.   Yeast infections which are caused by a naturally occurring fungus called candida.   Vaginal atrophy (atrophic vaginosis) which results from the thinning of the vagina from reduced estrogen levels after menopause.   Trichomoniasis which is caused by a parasite and is commonly transmitted by sexual intercourse.  Factors that increase your risk of developing vaginosis include: Marland Kitchen Medications, such as antibiotics and steroids . Uncontrolled diabetes . Use of hygiene products such as bubble bath, vaginal spray or vaginal deodorant . Douching . Wearing damp or tight-fitting clothing . Using an intrauterine device (IUD) for birth control . Hormonal changes, such as those associated with pregnancy, birth control pills or menopause . Sexual activity . Having a sexually transmitted infection  Your treatment plan is A single Diflucan (fluconazole) 150mg  tablet once. I have sent in an additional pill to take 3 days later if symptoms are completely resolved.  I have electronically sent this prescription into the pharmacy that you have chosen.  Be sure to take all of the medication as directed. Stop taking any medication if you develop a rash, tongue swelling or shortness of breath. Mothers who are breast feeding should consider  pumping and discarding their breast milk while on these antibiotics. However, there is no consensus that infant exposure at these doses would be harmful.  Remember that medication creams can weaken latex condoms. Marland Kitchen   HOME CARE:  Good hygiene may prevent some types of vaginosis from recurring and may relieve some symptoms:  . Avoid baths, hot tubs and whirlpool spas. Rinse soap from your outer genital area after a shower, and dry the area well to prevent irritation. Don't use scented or harsh soaps, such as those with deodorant or antibacterial action. Marland Kitchen Avoid irritants. These include scented tampons and pads. . Wipe from front to back after using the toilet. Doing so avoids spreading fecal bacteria to your vagina.  Other things that may help prevent vaginosis include:  Marland Kitchen Don't douche. Your vagina doesn't require cleansing other than normal bathing. Repetitive douching disrupts the normal organisms that reside in the vagina and can actually increase your risk of vaginal infection. Douching won't clear up a vaginal infection. . Use a latex condom. Both female and female latex condoms may help you avoid infections spread by sexual contact. . Wear cotton underwear. Also wear pantyhose with a cotton crotch. If you feel comfortable without it, skip wearing underwear to bed. Yeast thrives in [employer] Your symptoms should improve in the next day or two.  GET HELP RIGHT AWAY IF:  . You have pain in your lower abdomen ( pelvic area or over your ovaries) . You develop nausea or vomiting . You develop a fever . Your discharge changes or worsens . You have persistent pain with intercourse . You  develop shortness of breath, a rapid pulse, or you faint.  These symptoms could be signs of problems or infections that need to be evaluated by a medical provider now.  MAKE SURE YOU    Understand these instructions.  Will watch your condition.  Will get help right away if you are not doing  well or get worse.  Your e-visit answers were reviewed by a board certified advanced clinical practitioner to complete your personal care plan. Depending upon the condition, your plan could have included both over the counter or prescription medications. Please review your pharmacy choice to make sure that you have choses a pharmacy that is open for you to pick up any needed prescription, Your safety is important to us. If you have drug allergies check your prescription carefully.   You can use MyChart to ask questions about today's visit, request a non-urgent call back, or ask for a work or school excuse for 24 hours related to this e-Visit. If it has been greater than 24 hours you will need to follow up with your provider, or enter a new e-Visit to address those concerns. You will get a MyChart message within the next two days asking about your experience. I hope that your e-visit has been valuable and will speed your recovery.

## 2018-07-31 DIAGNOSIS — J029 Acute pharyngitis, unspecified: Secondary | ICD-10-CM | POA: Diagnosis not present

## 2018-07-31 DIAGNOSIS — J069 Acute upper respiratory infection, unspecified: Secondary | ICD-10-CM | POA: Diagnosis not present

## 2018-09-02 ENCOUNTER — Emergency Department (HOSPITAL_COMMUNITY)
Admission: EM | Admit: 2018-09-02 | Discharge: 2018-09-02 | Disposition: A | Discharge status: Home / Self Care | Payer: 59 | Attending: Emergency Medicine | Admitting: Emergency Medicine

## 2018-09-02 ENCOUNTER — Encounter (HOSPITAL_COMMUNITY): Payer: Self-pay

## 2018-09-02 DIAGNOSIS — B9789 Other viral agents as the cause of diseases classified elsewhere: Secondary | ICD-10-CM | POA: Insufficient documentation

## 2018-09-02 DIAGNOSIS — H1031 Unspecified acute conjunctivitis, right eye: Secondary | ICD-10-CM | POA: Insufficient documentation

## 2018-09-02 DIAGNOSIS — Z79899 Other long term (current) drug therapy: Secondary | ICD-10-CM | POA: Insufficient documentation

## 2018-09-02 DIAGNOSIS — J069 Acute upper respiratory infection, unspecified: Secondary | ICD-10-CM | POA: Diagnosis not present

## 2018-09-02 DIAGNOSIS — H5789 Other specified disorders of eye and adnexa: Secondary | ICD-10-CM | POA: Diagnosis present

## 2018-09-02 DIAGNOSIS — R05 Cough: Secondary | ICD-10-CM | POA: Diagnosis not present

## 2018-09-02 LAB — GROUP A STREP BY PCR: GROUP A STREP BY PCR: NOT DETECTED

## 2018-09-02 MED ORDER — IBUPROFEN 600 MG PO TABS: 600.0000 mg | ORAL_TABLET | Freq: Four times a day (QID) | ORAL | 0 refills | Status: DC | PRN

## 2018-09-02 MED ORDER — BENZONATATE 100 MG PO CAPS: 100.0000 mg | ORAL_CAPSULE | Freq: Three times a day (TID) | ORAL | 0 refills | Status: DC

## 2018-09-02 MED ORDER — TOBRAMYCIN 0.3 % OP SOLN: 1.0000 [drp] | OPHTHALMIC | 0 refills | Status: DC

## 2018-09-02 NOTE — Discharge Instructions (Signed)
Return if any problems.

## 2018-09-02 NOTE — ED Notes (Signed)
Patient verbalizes understanding of discharge instructions. Opportunity for questioning and answers were provided. Pt discharged from ED. 

## 2018-09-02 NOTE — ED Triage Notes (Signed)
Pt presents for evaluation of sore throat and productive cough with yellow and green sputum starting sunday. Pt had some matting of R eye last night with discharge. Reports sick contacts at work. Reports cold chills and fever earlier this week.

## 2018-09-02 NOTE — ED Provider Notes (Signed)
[facility] EMERGENCY DEPARTMENT Provider Note   CSN: [id number] Arrival date & time: 09/02/18  4128     History   Chief Complaint Chief Complaint  Patient presents with  . Sore Throat  . URI    HPI Jody Clark is a 28 y.o. female.  The history is provided by the patient. No language interpreter was used.  Sore Throat  This is a new problem. The current episode started 2 days ago. The problem occurs constantly. The problem has been gradually worsening. Nothing aggravates the symptoms. She has tried nothing for the symptoms. The treatment provided no relief.  URI     Pt complains of redness to her right eye.  Pt reports she has a sore throat and a cough.  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  Multiple influenza and rsv exposures this week. .   Past Medical History:  Diagnosis Date  . Acid reflux     There are no active problems to display for this patient.   Past Surgical History:  Procedure Laterality Date  . APPENDECTOMY  10/05/2016   lap appy  . LAPAROSCOPIC APPENDECTOMY N/A 10/05/2016   Procedure: APPENDECTOMY LAPAROSCOPIC;  Surgeon: Harriette Bouillon, MD;  Location: [facility] OR;  Service: General;  Laterality: N/A;     OB History   No obstetric history on file.      Home Medications    Prior to Admission medications   Medication Sig Start Date End Date Taking? Authorizing Provider  benzonatate (TESSALON) 100 MG capsule Take 1 capsule (100 mg total) by mouth every 8 (eight) hours. 09/02/18   Elson Areas, PA-C  fluconazole (DIFLUCAN) 150 MG tablet Take 1 tablet by mouth. May repeat in 3 days if needed. 05/11/18   Waldon Merl, PA-C  ibuprofen (ADVIL,MOTRIN) 600 MG tablet Take 1 tablet (600 mg total) by mouth every 6 (six) hours as needed. 09/02/18   Elson Areas, PA-C  levonorgestrel-ethinyl estradiol (AVIANE,ALESSE,LESSINA) 0.1-20 MG-MCG tablet Take 1 tablet by mouth daily. 12/17/17   Ofilia Neas, PA-C  tobramycin (TOBREX) 0.3  % ophthalmic solution Place 1 drop into the right eye every 4 (four) hours. 09/02/18   Elson Areas, PA-C  valACYclovir (VALTREX) 1000 MG tablet Take 1 tablet (1,000 mg total) by mouth 3 (three) times daily. 02/09/18   Wallis Bamberg, PA-C    Family History No family history on file.  Social History Social History   Tobacco Use  . Smoking status: Never Smoker  . Smokeless tobacco: Never Used  Substance Use Topics  . Alcohol use: Yes    Alcohol/week: 1.0 standard drinks    Types: 1 Cans of beer per week  . Drug use: No     Allergies   Patient has no known allergies.   Review of Systems Review of Systems  All other systems reviewed and are negative.    Physical Exam Updated Vital Signs BP 125/84 (BP Location: Right Arm)   Pulse 98   Temp 98.4 F (36.9 C) (Oral)   Resp 17   LMP 08/20/2018 (Exact Date)   SpO2 100%   Physical Exam Vitals signs and nursing note reviewed.  Constitutional:      Appearance: She is well-developed.  HENT:     Head: Normocephalic.     Right Ear: Tympanic membrane normal.     Left Ear: Tympanic membrane normal.     Mouth/Throat:     Mouth: Mucous membranes are moist.  Tonsils: Swelling: 0 on the right. 0 on the left.  Eyes:     Comments: Injected right conjunctiva   Neck:     Musculoskeletal: Normal range of motion.  Cardiovascular:     Rate and Rhythm: Normal rate.  Pulmonary:     Effort: Pulmonary effort is normal.  Abdominal:     General: There is no distension.  Musculoskeletal: Normal range of motion.  Skin:    General: Skin is warm.  Neurological:     Mental Status: She is alert and oriented to person, place, and time.      ED Treatments / Results  Labs (all labs ordered are listed, but only abnormal results are displayed) Labs Reviewed  GROUP A STREP BY PCR    EKG None  Radiology No results found.  Procedures Procedures (including critical care time)  Medications Ordered in ED Medications - No data to  display   Initial Impression / Assessment and Plan / ED Course  I have reviewed the triage vital signs and the nursing notes.  Pertinent labs & imaging results that were available during my care of the patient were reviewed by me and considered in my medical decision making (see chart for details).     MDM  Pt counseled on viral illness out of work x 48 hours.   Final Clinical Impressions(s) / ED Diagnoses   Final diagnoses:  Acute conjunctivitis of right eye, unspecified acute conjunctivitis type  Viral URI with cough    ED Discharge Orders         Ordered    tobramycin (TOBREX) 0.3 % ophthalmic solution  Every 4 hours     09/02/18 0945    benzonatate (TESSALON) 100 MG capsule  Every 8 hours     09/02/18 0945    ibuprofen (ADVIL,MOTRIN) 600 MG tablet  Every 6 hours PRN     09/02/18 0945           Elson Areas, PA-C 09/02/18 0951    Benjiman Core, MD 09/02/18 1535

## 2018-09-12 ENCOUNTER — Encounter (HOSPITAL_COMMUNITY): Payer: Self-pay | Admitting: Emergency Medicine

## 2018-09-12 ENCOUNTER — Ambulatory Visit (HOSPITAL_COMMUNITY)
Admission: EM | Admit: 2018-09-12 | Discharge: 2018-09-12 | Disposition: A | Discharge status: Home / Self Care | Payer: 59 | Attending: Family Medicine | Admitting: Family Medicine

## 2018-09-12 DIAGNOSIS — B9789 Other viral agents as the cause of diseases classified elsewhere: Secondary | ICD-10-CM | POA: Diagnosis not present

## 2018-09-12 DIAGNOSIS — J069 Acute upper respiratory infection, unspecified: Secondary | ICD-10-CM | POA: Insufficient documentation

## 2018-09-12 MED ORDER — PREDNISONE 50 MG PO TABS: ORAL_TABLET | ORAL | 0 refills | Status: DC

## 2018-09-12 MED ORDER — HYDROCODONE-HOMATROPINE 5-1.5 MG/5ML PO SYRP: 5.0000 mL | ORAL_SOLUTION | Freq: Four times a day (QID) | ORAL | 0 refills | Status: DC | PRN

## 2018-09-12 NOTE — ED Triage Notes (Signed)
Pt here for cough x 3 weeks  

## 2018-09-12 NOTE — ED Provider Notes (Signed)
[facility]    CSN: [id number] Arrival date & time: 09/12/18  1301     History   Chief Complaint Chief Complaint  Patient presents with  . Cough    appt 1315    HPI Jody Clark is a 28 y.o. female.   This is a 28 year old woman who comes in for the first time in over 3 years to the most [facility] urgent care, at this time complaining about cough for 3 weeks.  Patient works as a Psychologist, sport and exercise at [employer].  Patient is a non-smoker and is now had no asthma history.  Right after Christmas she developed sweats and chills along with upper respiratory symptoms.  The systemic symptoms cleared but she is continued to cough.  Patient is a non-smoker and does not have a history of asthma.  Her cough is worse at night.  She has had no hemoptysis.  There is been very little productive phlegm.     Past Medical History:  Diagnosis Date  . Acid reflux     There are no active problems to display for this patient.   Past Surgical History:  Procedure Laterality Date  . APPENDECTOMY  10/05/2016   lap appy  . LAPAROSCOPIC APPENDECTOMY N/A 10/05/2016   Procedure: APPENDECTOMY LAPAROSCOPIC;  Surgeon: Harriette Bouillon, MD;  Location: [facility] OR;  Service: General;  Laterality: N/A;    OB History   No obstetric history on file.      Home Medications    Prior to Admission medications   Medication Sig Start Date End Date Taking? Authorizing Provider  HYDROcodone-homatropine (HYDROMET) 5-1.5 MG/5ML syrup Take 5 mLs by mouth every 6 (six) hours as needed for cough. 09/12/18   Elvina Sidle, MD  levonorgestrel-ethinyl estradiol (AVIANE,ALESSE,LESSINA) 0.1-20 MG-MCG tablet Take 1 tablet by mouth daily. 12/17/17   Ofilia Neas, PA-C  predniSONE (DELTASONE) 50 MG tablet One daily with food 09/12/18   Elvina Sidle, MD    Family History History reviewed. No pertinent family history.  Social History Social History   Tobacco Use  . Smoking status: Never Smoker  .  Smokeless tobacco: Never Used  Substance Use Topics  . Alcohol use: Yes    Alcohol/week: 1.0 standard drinks    Types: 1 Cans of beer per week  . Drug use: No     Allergies   Patient has no known allergies.   Review of Systems Review of Systems   Physical Exam Triage Vital Signs ED Triage Vitals [09/12/18 1329]  Enc Vitals Group     BP (!) 130/95     Pulse Rate 92     Resp 18     Temp 98.4 F (36.9 C)     Temp Source Oral     SpO2 100 %     Weight      Height      Head Circumference      Peak Flow      Pain Score 0     Pain Loc      Pain Edu?      Excl. in GC?    No data found.  Updated Vital Signs BP (!) 130/95 (BP Location: Right Arm)   Pulse 92   Temp 98.4 F (36.9 C) (Oral)   Resp 18   LMP 08/20/2018 (Exact Date)   SpO2 100%    Physical Exam Vitals signs and nursing note reviewed.  Constitutional:      Appearance: Normal appearance.  HENT:  Head: Normocephalic and atraumatic.     Right Ear: Tympanic membrane and external ear normal.     Left Ear: Tympanic membrane and external ear normal.     Nose: Nose normal.     Mouth/Throat:     Mouth: Mucous membranes are moist.  Eyes:     Conjunctiva/sclera: Conjunctivae normal.  Neck:     Musculoskeletal: Normal range of motion and neck supple.  Cardiovascular:     Rate and Rhythm: Normal rate and regular rhythm.  Pulmonary:     Effort: Pulmonary effort is normal.     Breath sounds: Normal breath sounds.  Musculoskeletal: Normal range of motion.  Skin:    General: Skin is warm and dry.  Neurological:     General: No focal deficit present.     Mental Status: She is alert and oriented to person, place, and time.      UC Treatments / Results  Labs (all labs ordered are listed, but only abnormal results are displayed) Labs Reviewed - No data to display  EKG None  Radiology No results found.  Procedures Procedures (including critical care time)  Medications Ordered in  UC Medications - No data to display  Initial Impression / Assessment and Plan / UC Course  I have reviewed the triage vital signs and the nursing notes.  Pertinent labs & imaging results that were available during my care of the patient were reviewed by me and considered in my medical decision making (see chart for details).    Final Clinical Impressions(s) / UC Diagnoses   Final diagnoses:  Viral URI with cough   Discharge Instructions   None    ED Prescriptions    Medication Sig Dispense Auth. Provider   HYDROcodone-homatropine (HYDROMET) 5-1.5 MG/5ML syrup Take 5 mLs by mouth every 6 (six) hours as needed for cough. 60 mL Elvina SidleLauenstein, Josep Luviano, MD   predniSONE (DELTASONE) 50 MG tablet One daily with food 5 tablet Elvina SidleLauenstein, Donyea Gafford, MD     Controlled Substance Prescriptions [place] Controlled Substance Registry consulted? Not Applicable   Elvina SidleLauenstein, Shlomo Seres, MD 09/12/18 1344

## 2018-12-18 ENCOUNTER — Other Ambulatory Visit: Payer: Self-pay | Admitting: Physician Assistant

## 2018-12-18 DIAGNOSIS — N920 Excessive and frequent menstruation with regular cycle: Secondary | ICD-10-CM

## 2019-06-14 DIAGNOSIS — K649 Unspecified hemorrhoids: Secondary | ICD-10-CM | POA: Diagnosis not present

## 2019-07-16 ENCOUNTER — Telehealth: Payer: 59 | Admitting: Family

## 2019-07-16 DIAGNOSIS — N760 Acute vaginitis: Secondary | ICD-10-CM

## 2019-07-16 DIAGNOSIS — B9689 Other specified bacterial agents as the cause of diseases classified elsewhere: Secondary | ICD-10-CM

## 2019-07-16 MED ORDER — METRONIDAZOLE 500 MG PO TABS: 500.0000 mg | ORAL_TABLET | Freq: Two times a day (BID) | ORAL | 0 refills | Status: DC

## 2019-07-16 NOTE — Progress Notes (Signed)

## 2020-03-04 DIAGNOSIS — Z20822 Contact with and (suspected) exposure to covid-19: Secondary | ICD-10-CM | POA: Diagnosis not present

## 2020-03-25 DIAGNOSIS — Z13220 Encounter for screening for lipoid disorders: Secondary | ICD-10-CM | POA: Diagnosis not present

## 2020-03-25 DIAGNOSIS — Z131 Encounter for screening for diabetes mellitus: Secondary | ICD-10-CM | POA: Diagnosis not present

## 2020-03-25 DIAGNOSIS — Z01419 Encounter for gynecological examination (general) (routine) without abnormal findings: Secondary | ICD-10-CM | POA: Diagnosis not present

## 2020-04-03 DIAGNOSIS — Z139 Encounter for screening, unspecified: Secondary | ICD-10-CM | POA: Diagnosis not present

## 2020-04-03 DIAGNOSIS — Z23 Encounter for immunization: Secondary | ICD-10-CM | POA: Diagnosis not present

## 2021-09-08 NOTE — Progress Notes (Signed)
Subjective:    Jody Clark is a 31 y.o. female and is here to establish care and a comprehensive physical exam.  HPI  Health Maintenance Due  Topic Date Due   Hepatitis C Screening  Never done   COVID-19 Vaccine (3 - Booster for Pfizer series) 06/11/2020    Acute Concerns: None discussed at this time.   Chronic Issues: None  Health Maintenance: Immunizations -- Covid- UTD; no booster Influenza-  UTD Tdap-  Due PAP -- Postponed;2019 Bone Density -- N/A Ophthalmology- UTD Dentistry- Needs a referral; not UTD Diet -- Eats all food groups Sleep habits -- Normal schedule Exercise -- At least once a week, trying to increase to 3x week  Current Weight -- Stable Weight History: Wt Readings from Last 10 Encounters:  09/09/21 144 lb 8 oz (65.5 kg)  02/06/18 132 lb 12.8 oz (60.2 kg)  12/17/17 131 lb 9.6 oz (59.7 kg)  12/07/17 132 lb (59.9 kg)  10/04/16 130 lb (59 kg)  05/04/13 110 lb (49.9 kg)   Body mass index is 26.43 kg/m. Mood -- Stable  Patient's last menstrual period was 09/07/2021 (exact date). Period characteristics -- Normal Birth control -- None currently    reports current alcohol use of about 1.0 standard drink per week.  Tobacco Use: High Risk   Smoking Tobacco Use: Some Days   Smokeless Tobacco Use: Never   Passive Exposure: Not on file     Depression screen [facility] 2/9 09/09/2021  Decreased Interest 0  Down, Depressed, Hopeless 0  PHQ - 2 Score 0     Other providers/specialists: Patient Care Team: Inda Coke, [place] as PCP - General (Physician Assistant)   PMHx, SurgHx, SocialHx, Medications, and Allergies were reviewed in the Visit Navigator and updated as appropriate.   Past Medical History:  Diagnosis Date   Acid reflux      Past Surgical History:  Procedure Laterality Date   APPENDECTOMY  10/05/2016   lap appy   LAPAROSCOPIC APPENDECTOMY N/A 10/05/2016   Procedure: APPENDECTOMY LAPAROSCOPIC;  Surgeon: Erroll Luna, MD;   Location: [facility];  Service: General;  Laterality: N/A;     Family History  Problem Relation Age of Onset   Hypertension Father     Social History   Tobacco Use   Smoking status: Some Days    Types: Cigars   Smokeless tobacco: Never  Substance Use Topics   Alcohol use: Yes    Alcohol/week: 1.0 standard drink    Types: 1 Cans of beer per week   Drug use: No    Review of Systems:   Review of Systems  Constitutional:  Negative for chills, fever, malaise/fatigue and weight loss.  HENT:  Negative for hearing loss, sinus pain and sore throat.   Respiratory:  Negative for cough and hemoptysis.   Cardiovascular:  Negative for chest pain, palpitations, leg swelling and PND.  Gastrointestinal:  Negative for abdominal pain, constipation, diarrhea, heartburn, nausea and vomiting.  Genitourinary:  Negative for dysuria, frequency and urgency.  Musculoskeletal:  Negative for back pain, myalgias and neck pain.  Skin:  Negative for itching and rash.  Neurological:  Negative for dizziness, tingling, seizures and headaches.  Endo/Heme/Allergies:  Negative for polydipsia.  Psychiatric/Behavioral:  Negative for depression. The patient is not nervous/anxious.    Objective:   BP 102/80 (BP Location: Left Arm, Patient Position: Sitting, Cuff Size: Normal)    Pulse 76    Temp 98.5 F (36.9 C) (Temporal)    Ht '5\' 2"'  (  1.575 m)    Wt 144 lb 8 oz (65.5 kg)    LMP 09/07/2021 (Exact Date)    SpO2 97%    BMI 26.43 kg/m   General Appearance:    Alert, cooperative, no distress, appears stated age  Head:    Normocephalic, without obvious abnormality, atraumatic  Eyes:    PERRL, conjunctiva/corneas clear, EOM's intact, fundi    benign, both eyes  Ears:    Normal TM's and external ear canals, both ears  Nose:   Nares normal, septum midline, mucosa normal, no drainage    or sinus tenderness  Throat:   Lips, mucosa, and tongue normal; teeth and gums normal  Neck:   Supple, symmetrical, trachea midline, no  adenopathy;    thyroid:  no enlargement/tenderness/nodules; no carotid   bruit or JVD  Back:     Symmetric, no curvature, ROM normal, no CVA tenderness  Lungs:     Clear to auscultation bilaterally, respirations unlabored  Chest Wall:    No tenderness or deformity   Heart:    Regular rate and rhythm, S1 and S2 normal, no murmur, rub   or gallop  Breast Exam:    Deferred   Abdomen:     Soft, non-tender, bowel sounds active all four quadrants,    no masses, no organomegaly  Genitalia:   Deferred  Rectal:   Deferred   Extremities:   Extremities normal, atraumatic, no cyanosis or edema  Pulses:   2+ and symmetric all extremities  Skin:   Skin color, texture, turgor normal, no rashes or lesions  Lymph nodes:   Cervical, supraclavicular, and axillary nodes normal  Neurologic:   CNII-XII intact, normal strength, sensation and reflexes    throughout    Assessment/Plan:   Routine Physical Examination Today patient counseled on age appropriate routine health concerns for screening and prevention, each reviewed and up to date or declined. Immunizations reviewed and up to date or declined. Labs ordered and reviewed. Risk factors for depression reviewed and negative. Hearing function and visual acuity are intact. ADLs screened and addressed as needed. Functional ability and level of safety reviewed and appropriate. Education, counseling and referrals performed based on assessed risks today. Patient provided with a copy of personalized plan for preventive services.  She will return for PAP  Overweight Update labs today, will make recommendations accordingly   - CBC with Differential/Platelet  - Comp Met  - Lipid Panel  STD testing She denies symptoms at this time Update STD panel and provide recommendations accordingly   Patient Counseling:   '[x]'     Nutrition: Stressed importance of moderation in sodium/caffeine intake, saturated fat and cholesterol, caloric balance, sufficient intake of  fresh fruits, vegetables, fiber, calcium, iron, and 1 mg of folate supplement per day (for females capable of pregnancy).   '[x]'      Stressed the importance of regular exercise.    '[x]'     Substance Abuse: Discussed cessation/primary prevention of tobacco, alcohol, or other drug use; driving or other dangerous activities under the influence; availability of treatment for abuse.    '[x]'      Injury prevention: Discussed safety belts, safety helmets, smoke detector, smoking near bedding or upholstery.    '[x]'      Sexuality: Discussed sexually transmitted diseases, partner selection, use of condoms, avoidance of unintended pregnancy  and contraceptive alternatives.    '[x]'     Dental health: Discussed importance of regular tooth brushing, flossing, and dental visits.   '[x]'      Health  maintenance and immunizations reviewed. Please refer to Health maintenance section.   I,Havlyn C Ratchford,acting as a Education administrator for [employer], PA.,have documented all relevant documentation on the behalf of Inda Coke, PA,as directed by  Inda Coke, PA while in the presence of Inda Coke, [place].  I, Inda Coke, [place], have reviewed all documentation for this visit. The documentation on 09/09/21 for the exam, diagnosis, procedures, and orders are all accurate and complete.  Inda Coke, PA-C [place]

## 2021-09-09 ENCOUNTER — Encounter: Payer: Self-pay | Admitting: Physician Assistant

## 2021-09-09 ENCOUNTER — Other Ambulatory Visit: Payer: Self-pay

## 2021-09-09 ENCOUNTER — Ambulatory Visit: Payer: 59 | Admitting: Physician Assistant

## 2021-09-09 VITALS — BP 102/80 | HR 76 | Temp 98.5°F | Ht 62.0 in | Wt 144.5 lb

## 2021-09-09 DIAGNOSIS — Z1159 Encounter for screening for other viral diseases: Secondary | ICD-10-CM

## 2021-09-09 DIAGNOSIS — Z113 Encounter for screening for infections with a predominantly sexual mode of transmission: Secondary | ICD-10-CM | POA: Diagnosis not present

## 2021-09-09 DIAGNOSIS — Z136 Encounter for screening for cardiovascular disorders: Secondary | ICD-10-CM

## 2021-09-09 DIAGNOSIS — Z0000 Encounter for general adult medical examination without abnormal findings: Secondary | ICD-10-CM | POA: Diagnosis not present

## 2021-09-09 DIAGNOSIS — Z13220 Encounter for screening for lipoid disorders: Secondary | ICD-10-CM

## 2021-09-09 DIAGNOSIS — Z114 Encounter for screening for human immunodeficiency virus [HIV]: Secondary | ICD-10-CM | POA: Diagnosis not present

## 2021-09-09 DIAGNOSIS — E663 Overweight: Secondary | ICD-10-CM

## 2021-09-09 LAB — CBC WITH DIFFERENTIAL/PLATELET
Basophils Absolute: 0 10*3/uL (ref 0.0–0.1)
Basophils Relative: 0.6 % (ref 0.0–3.0)
Eosinophils Absolute: 0.1 10*3/uL (ref 0.0–0.7)
Eosinophils Relative: 1.7 % (ref 0.0–5.0)
HCT: 39.5 % (ref 36.0–46.0)
Hemoglobin: 13.3 g/dL (ref 12.0–15.0)
Lymphocytes Relative: 34.8 % (ref 12.0–46.0)
Lymphs Abs: 2.1 10*3/uL (ref 0.7–4.0)
MCHC: 33.7 g/dL (ref 30.0–36.0)
MCV: 89.4 fl (ref 78.0–100.0)
Monocytes Absolute: 0.5 10*3/uL (ref 0.1–1.0)
Monocytes Relative: 8.1 % (ref 3.0–12.0)
Neutro Abs: 3.3 10*3/uL (ref 1.4–7.7)
Neutrophils Relative %: 54.8 % (ref 43.0–77.0)
Platelets: 264 10*3/uL (ref 150.0–400.0)
RBC: 4.42 Mil/uL (ref 3.87–5.11)
RDW: 12.6 % (ref 11.5–15.5)
WBC: 6.1 10*3/uL (ref 4.0–10.5)

## 2021-09-09 LAB — COMPREHENSIVE METABOLIC PANEL
ALT: 14 U/L (ref 0–35)
AST: 19 U/L (ref 0–37)
Albumin: 4.4 g/dL (ref 3.5–5.2)
Alkaline Phosphatase: 63 U/L (ref 39–117)
BUN: 11 mg/dL (ref 6–23)
CO2: 29 mEq/L (ref 19–32)
Calcium: 9.4 mg/dL (ref 8.4–10.5)
Chloride: 103 mEq/L (ref 96–112)
Creatinine, Ser: 1.09 mg/dL (ref 0.40–1.20)
GFR: 68.51 mL/min (ref >60.00)
Glucose, Bld: 90 mg/dL (ref 70–99)
Potassium: 3.2 mEq/L — ABNORMAL LOW (ref 3.5–5.1)
Sodium: 138 mEq/L (ref 135–145)
Total Bilirubin: 0.6 mg/dL (ref 0.2–1.2)
Total Protein: 7.3 g/dL (ref 6.0–8.3)

## 2021-09-09 LAB — LIPID PANEL
Cholesterol: 173 mg/dL (ref 0–200)
HDL: 56.3 mg/dL (ref >39.00)
LDL Cholesterol: 98 mg/dL (ref 0–99)
NonHDL: 116.67
Total CHOL/HDL Ratio: 3
Triglycerides: 94 mg/dL (ref 0.0–149.0)
VLDL: 18.8 mg/dL (ref 0.0–40.0)

## 2021-09-09 NOTE — Addendum Note (Signed)
Addended by: Lorn Junes on: 09/09/2021 12:35 PM   Modules accepted: Orders

## 2021-09-09 NOTE — Patient Instructions (Addendum)
It was great to see you!  Consider Dr. Yetta Barre for your dentist! [address], [place], [place] [address] [phone]  Please go to the lab for blood work.   Our office will call you with your results unless you have chosen to receive results via MyChart.  If your blood work is normal we will follow-up each year for physicals and as scheduled for chronic medical problems.  If anything is abnormal we will treat accordingly and get you in for a follow-up.  Take care,  Lelon Mast

## 2021-09-10 LAB — HEPATITIS C ANTIBODY
Hepatitis C Ab: NONREACTIVE
SIGNAL TO CUT-OFF: <0.02 (ref <1.00)

## 2021-09-10 LAB — RPR: RPR Ser Ql: NONREACTIVE

## 2021-09-10 LAB — HIV ANTIBODY (ROUTINE TESTING W REFLEX): HIV 1&2 Ab, 4th Generation: NONREACTIVE

## 2022-01-12 ENCOUNTER — Telehealth: Payer: 59 | Admitting: Physician Assistant

## 2022-01-12 DIAGNOSIS — B3731 Acute candidiasis of vulva and vagina: Secondary | ICD-10-CM | POA: Diagnosis not present

## 2022-01-12 MED ORDER — FLUCONAZOLE 150 MG PO TABS: 150.0000 mg | ORAL_TABLET | Freq: Once | ORAL | 0 refills | Status: AC

## 2022-01-12 NOTE — Progress Notes (Signed)
I have spent 5 minutes in review of e-visit questionnaire, review and updating patient chart, medical decision making and response to patient.   Nareh Matzke Cody Bryahna Lesko, PA-C    

## 2022-01-12 NOTE — Progress Notes (Signed)

## 2022-03-15 DIAGNOSIS — J029 Acute pharyngitis, unspecified: Secondary | ICD-10-CM | POA: Diagnosis not present

## 2022-03-15 DIAGNOSIS — Z6828 Body mass index (BMI) 28.0-28.9, adult: Secondary | ICD-10-CM | POA: Diagnosis not present

## 2022-03-15 DIAGNOSIS — U071 COVID-19: Secondary | ICD-10-CM | POA: Diagnosis not present

## 2022-05-24 ENCOUNTER — Encounter: Payer: Self-pay | Admitting: *Deleted

## 2022-08-12 ENCOUNTER — Encounter: Payer: Self-pay | Admitting: *Deleted

## 2022-12-20 ENCOUNTER — Ambulatory Visit
Admission: EM | Admit: 2022-12-20 | Discharge: 2022-12-20 | Disposition: A | Discharge status: Home / Self Care | Payer: 59 | Attending: Urgent Care | Admitting: Urgent Care

## 2022-12-20 DIAGNOSIS — M62830 Muscle spasm of back: Secondary | ICD-10-CM | POA: Diagnosis not present

## 2022-12-20 DIAGNOSIS — S39012A Strain of muscle, fascia and tendon of lower back, initial encounter: Secondary | ICD-10-CM

## 2022-12-20 MED ORDER — NAPROXEN 500 MG PO TABS: 500.0000 mg | ORAL_TABLET | Freq: Two times a day (BID) | ORAL | 0 refills | Status: DC

## 2022-12-20 MED ORDER — KETOROLAC TROMETHAMINE 30 MG/ML IJ SOLN
30.0000 mg | Freq: Once | INTRAMUSCULAR | Status: DC
  Administered 2022-12-20: 30 mg via INTRAMUSCULAR

## 2022-12-20 MED ORDER — TIZANIDINE HCL 4 MG PO TABS: 4.0000 mg | ORAL_TABLET | Freq: Every day | ORAL | 0 refills | Status: DC

## 2022-12-20 NOTE — ED Triage Notes (Signed)
Pt c/o Right-sided mid to lower pain x1 week. Pt had been managing with ice and rest. Yesterday went running and exacerbated the pain. Feels like muscle is being stretched/ripped.

## 2022-12-20 NOTE — ED Provider Notes (Signed)
[facility]  Note:  This document was prepared using Dragon voice recognition sConservation officer, historic buildingsntional dictation errors.  MRN: [id number] DOB: [birth year]  Subjective:   Jody Clark is a 32 y.o. female presenting for 1 week history of persistent intermittent right lower abdominal pain, tightness and pulling sensation.  Symptoms started after she did a workout involving squats and pull ups.  The next day she had significant pain that lasted for several days.  It improved quite a bit but did not resolve and then yesterday she went for a run which exacerbated the pain again.  No fall, trauma, numbness or tingling, saddle paresthesia, changes to bowel or urinary habits, radicular symptoms, hematuria, history of renal stone.  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  No current facility-administered medications for this encounter. No current outpatient medications on file.   No Known Allergies  Past Medical History:  Diagnosis Date   Acid reflux      Past Surgical History:  Procedure Laterality Date   APPENDECTOMY  10/05/2016   lap appy   LAPAROSCOPIC APPENDECTOMY N/A 10/05/2016   Procedure: APPENDECTOMY LAPAROSCOPIC;  Surgeon: Harriette Bouillon, MD;  Location: [facility];  Service: General;  Laterality: N/A;    Family History  Problem Relation Age of Onset   Hypertension Father    Prostate cancer Neg Hx    Breast cancer Neg Hx     Social History   Tobacco Use   Smoking status: Some Days    Types: Cigars   Smokeless tobacco: Never  Vaping Use   Vaping Use: Some days  Substance Use Topics   Alcohol use: Yes    Alcohol/week: 1.0 standard drink of alcohol    Types: 1 Cans of beer per week   Drug use: No    ROS   Objective:   Vitals: BP 118/79 (BP Location: Right Arm)   Pulse 75   Temp 98.3 F (36.8 C) (Oral)   Resp 18   LMP 12/01/2022 (Approximate)   SpO2 98%   Physical  Exam Constitutional:      General: She is not in acute distress.    Appearance: Normal appearance. She is well-developed. She is not ill-appearing, toxic-appearing or diaphoretic.  HENT:     Head: Normocephalic and atraumatic.     Right Ear: External ear normal.     Left Ear: External ear normal.     Nose: Nose normal.     Mouth/Throat:     Mouth: Mucous membranes are moist.  Eyes:     General: No scleral icterus.       Right eye: No discharge.        Left eye: No discharge.     Extraocular Movements: Extraocular movements intact.  Cardiovascular:     Rate and Rhythm: Normal rate.  Pulmonary:     Effort: Pulmonary effort is normal.  Musculoskeletal:     Lumbar back: Spasms and tenderness (overlying the area outlined) present. No swelling, edema, deformity, signs of trauma, lacerations or bony tenderness. Normal range of motion. Negative right straight leg raise test and negative left straight leg raise test. No scoliosis.       Back:  Skin:    General: Skin is warm and dry.  Neurological:     General: No focal deficit present.     Mental Status: She is alert and oriented to person, place, and time.     Motor:  No weakness.     Coordination: Coordination normal.     Gait: Gait normal.     Deep Tendon Reflexes: Reflexes normal.  Psychiatric:        Mood and Affect: Mood normal.        Behavior: Behavior normal.        Thought Content: Thought content normal.        Judgment: Judgment normal.    IM Toradol administered in clinic at 30 mg.  Assessment and Plan :   PDMP not reviewed this encounter.  1. Lumbar strain, initial encounter   2. Spasm of muscle of lower back    Will manage conservatively for back strain with NSAID and muscle relaxant, rest and modification of physical activity.  Anticipatory guidance provided.  Reviewed back care, recommend follow-up with an orthopedist.  Counseled patient on potential for adverse effects with medications prescribed/recommended  today, ER and return-to-clinic precautions discussed, patient verbalized understanding.    Wallis Bamberg, [place] 12/20/22 [phone]

## 2022-12-22 ENCOUNTER — Ambulatory Visit: Payer: 59 | Admitting: Physician Assistant

## 2023-01-25 ENCOUNTER — Ambulatory Visit: Payer: 59 | Admitting: Physician Assistant

## 2023-01-25 ENCOUNTER — Encounter: Payer: Self-pay | Admitting: Physician Assistant

## 2023-01-25 VITALS — BP 122/80 | HR 61 | Temp 97.7°F | Ht 62.0 in | Wt 156.2 lb

## 2023-01-25 DIAGNOSIS — R635 Abnormal weight gain: Secondary | ICD-10-CM

## 2023-01-25 DIAGNOSIS — R232 Flushing: Secondary | ICD-10-CM

## 2023-01-25 DIAGNOSIS — M7989 Other specified soft tissue disorders: Secondary | ICD-10-CM

## 2023-01-25 LAB — CBC WITH DIFFERENTIAL/PLATELET
Basophils Absolute: 0 10*3/uL (ref 0.0–0.1)
Basophils Relative: 1.1 % (ref 0.0–3.0)
Eosinophils Absolute: 0.1 10*3/uL (ref 0.0–0.7)
Eosinophils Relative: 2.8 % (ref 0.0–5.0)
HCT: 41.7 % (ref 36.0–46.0)
Hemoglobin: 13.9 g/dL (ref 12.0–15.0)
Lymphocytes Relative: 36.5 % (ref 12.0–46.0)
Lymphs Abs: 1.7 10*3/uL (ref 0.7–4.0)
MCHC: 33.4 g/dL (ref 30.0–36.0)
MCV: 89 fl (ref 78.0–100.0)
Monocytes Absolute: 0.5 10*3/uL (ref 0.1–1.0)
Monocytes Relative: 10.8 % (ref 3.0–12.0)
Neutro Abs: 2.3 10*3/uL (ref 1.4–7.7)
Neutrophils Relative %: 48.8 % (ref 43.0–77.0)
Platelets: 286 10*3/uL (ref 150.0–400.0)
RBC: 4.68 Mil/uL (ref 3.87–5.11)
RDW: 12.7 % (ref 11.5–15.5)
WBC: 4.7 10*3/uL (ref 4.0–10.5)

## 2023-01-25 LAB — COMPREHENSIVE METABOLIC PANEL
ALT: 21 U/L (ref 0–35)
AST: 22 U/L (ref 0–37)
Albumin: 4.2 g/dL (ref 3.5–5.2)
Alkaline Phosphatase: 78 U/L (ref 39–117)
BUN: 12 mg/dL (ref 6–23)
CO2: 28 mEq/L (ref 19–32)
Calcium: 9.6 mg/dL (ref 8.4–10.5)
Chloride: 102 mEq/L (ref 96–112)
Creatinine, Ser: 1 mg/dL (ref 0.40–1.20)
GFR: 75.24 mL/min (ref >60.00)
Glucose, Bld: 100 mg/dL — ABNORMAL HIGH (ref 70–99)
Potassium: 4.1 mEq/L (ref 3.5–5.1)
Sodium: 138 mEq/L (ref 135–145)
Total Bilirubin: 0.5 mg/dL (ref 0.2–1.2)
Total Protein: 6.8 g/dL (ref 6.0–8.3)

## 2023-01-25 LAB — URINALYSIS, ROUTINE W REFLEX MICROSCOPIC
Bilirubin Urine: NEGATIVE
Ketones, ur: NEGATIVE
Leukocytes,Ua: NEGATIVE
Nitrite: NEGATIVE
Specific Gravity, Urine: 1.01 (ref 1.000–1.030)
Total Protein, Urine: NEGATIVE
Urine Glucose: NEGATIVE
Urobilinogen, UA: 0.2 (ref 0.0–1.0)
WBC, UA: NONE SEEN (ref 0–?)
pH: 6.5 (ref 5.0–8.0)

## 2023-01-25 LAB — FOLLICLE STIMULATING HORMONE: FSH: 76 m[IU]/mL

## 2023-01-25 LAB — LUTEINIZING HORMONE: LH: 56.47 m[IU]/mL

## 2023-01-25 LAB — TSH: TSH: 2.26 u[IU]/mL (ref 0.35–5.50)

## 2023-01-25 LAB — HEMOGLOBIN A1C: Hgb A1c MFr Bld: 5.9 % (ref 4.6–6.5)

## 2023-01-25 NOTE — Patient Instructions (Signed)
It was great to see you!  We will update blood work and urine today for further evaluation Keep me posted on your symptoms  Let's follow-up for a physical whenever is convenient for you, sooner if you have concerns.  Take care,  Jarold Motto PA-C

## 2023-01-25 NOTE — Progress Notes (Signed)
Jody Clark is a 32 y.o. female here for a new problem.  History of Present Illness:   Chief Complaint  Patient presents with  . Edema    Pt c/o swelling in feet and ankles x 1 week, also having hot flashes x several weeks.    HPI  Feet/ankle swelling: She reports swelling in feet and ankles for 1 week. She recently returned from a trip to [place] from 5/21-5/27.  She states that the swelling in her feet has resolved since yesterday.  Denies chest pain, shortness of breath.  Hot flashes: She also complain of sporadic hot flashes for a couple of weeks.  She was previously taking Naproxen and Flexeril for back pain.  She has been eating healthier and exercising more.  She notes she has not had her period this month She tends to get her period at the end/start of the month. She has not been sexually active with no chance of pregnancy. She has been gaining weight unintentionally as well.  Wt Readings from Last 4 Encounters:  01/25/23 156 lb 4 oz (70.9 kg)  09/09/21 144 lb 8 oz (65.5 kg)  02/06/18 132 lb 12.8 oz (60.2 kg)  12/17/17 131 lb 9.6 oz (59.7 kg)    Past Medical History:  Diagnosis Date  . Acid reflux      Social History   Tobacco Use  . Smoking status: Some Days    Types: Cigars  . Smokeless tobacco: Never  Vaping Use  . Vaping Use: Some days  Substance Use Topics  . Alcohol use: Yes    Alcohol/week: 1.0 standard drink of alcohol    Types: 1 Cans of beer per week  . Drug use: No    Past Surgical History:  Procedure Laterality Date  . APPENDECTOMY  10/05/2016   lap appy  . LAPAROSCOPIC APPENDECTOMY N/A 10/05/2016   Procedure: APPENDECTOMY LAPAROSCOPIC;  Surgeon: Harriette Bouillon, MD;  Location: [facility];  Service: General;  Laterality: N/A;    Family History  Problem Relation Age of Onset  . Hypertension Father   . Prostate cancer Neg Hx   . Breast cancer Neg Hx   . Diabetes Neg Hx     No Known Allergies  Current Medications:  No current  outpatient medications on file.   Review of Systems:   ROS Negative unless otherwise specified per HPI.  Vitals:   Vitals:   01/25/23 1106  BP: 122/80  Pulse: 61  Temp: 97.7 F (36.5 C)  TempSrc: Temporal  SpO2: 100%  Weight: 156 lb 4 oz (70.9 kg)  Height: 5\' 2"  (1.575 m)     Body mass index is 28.58 kg/m.  Physical Exam:   Physical Exam Vitals and nursing note reviewed.  Constitutional:      General: She is not in acute distress.    Appearance: She is well-developed. She is not ill-appearing or toxic-appearing.  Cardiovascular:     Rate and Rhythm: Normal rate and regular rhythm.     Pulses: Normal pulses.          Dorsalis pedis pulses are 2+ on the right side and 2+ on the left side.       Posterior tibial pulses are 2+ on the right side and 2+ on the left side.     Heart sounds: Normal heart sounds, S1 normal and S2 normal.  Pulmonary:     Effort: Pulmonary effort is normal.     Breath sounds: Normal breath sounds.  Musculoskeletal:  Right lower leg: No edema.     Left lower leg: No edema.     Comments: No calf swelling or tenderness appreciated  Skin:    General: Skin is warm and dry.  Neurological:     Mental Status: She is alert.     GCS: GCS eye subscore is 4. GCS verbal subscore is 5. GCS motor subscore is 6.  Psychiatric:        Speech: Speech normal.        Behavior: Behavior normal. Behavior is cooperative.    Assessment and Plan:   Hot flashes Unclear etiology Update blood work today to assess for possible cause Recommend continued adequate hydration and limited salt intake Follow-up based on clinical response and blood work results  Weight gain Unclear etiology Recommend continued exercising and healthy eating Update blood work to rule out organic cause Follow-up soon for CPE for further discussion  Bilateral swelling of feet Symptoms have resolved We will still update blood work and urine per patient's request to assess for any  abnormalities Continue to work on adequate hydration and reduce salt intake   I,Rachel Rivera,acting as a Neurosurgeon for [employer], PA.,have documented all relevant documentation on the behalf of Jarold Motto, PA,as directed by  Jarold Motto, PA while in the presence of Jarold Motto, [place].  I, Jarold Motto, [place], have reviewed all documentation for this visit. The documentation on 01/25/23 for the exam, diagnosis, procedures, and orders are all accurate and complete.   Jarold Motto, PA-C

## 2023-01-27 ENCOUNTER — Other Ambulatory Visit: Payer: Self-pay | Admitting: Physician Assistant

## 2023-01-27 DIAGNOSIS — R232 Flushing: Secondary | ICD-10-CM

## 2023-01-27 DIAGNOSIS — R7989 Other specified abnormal findings of blood chemistry: Secondary | ICD-10-CM

## 2023-02-01 ENCOUNTER — Encounter: Payer: Self-pay | Admitting: Radiology

## 2023-02-01 ENCOUNTER — Ambulatory Visit: Payer: 59 | Admitting: Radiology

## 2023-02-01 VITALS — BP 120/78 | Ht 62.0 in | Wt 155.0 lb

## 2023-02-01 DIAGNOSIS — E2839 Other primary ovarian failure: Secondary | ICD-10-CM | POA: Diagnosis not present

## 2023-02-01 NOTE — Progress Notes (Signed)
   Jody Clark [birth year] [id number]   History:  32 y.o. G0 presents as a new patient referred by PCP for elevated FSH level. She presented to her PCP with hot flashes and a missed period. Denies any night sweats or other symptoms. She has a female partner. Aunt had early menopause.  Gynecologic History Patient's last menstrual period was 12/24/2022 (approximate). Period Pattern: (!) Irregular (skipped period 12/2022) Menstrual Flow: Moderate Menstrual Control: Maxi pad, Thin pad Dysmenorrhea: (!) Mild Dysmenorrhea Symptoms: Cramping Contraception/Family planning:  female partner Sexually active: yes Last Pap: 2022. Results were: normal   Obstetric History OB History  Gravida Para Term Preterm AB Living  0 0 0 0 0 0  SAB IAB Ectopic Multiple Live Births  0 0 0 0 0    Latest Reference Range & Units 01/25/23 11:31  LH mIU/mL 56.47  FSH mIU/ML 76.0    Latest Reference Range & Units 01/25/23 11:31  TSH 0.35 - 5.50 uIU/mL 2.26    The following portions of the patient's history were reviewed and updated as appropriate: allergies, current medications, past family history, past medical history, past social history, past surgical history, and problem list.  Review of Systems Pertinent items noted in HPI and remainder of comprehensive ROS otherwise negative.   Past medical history, past surgical history, family history and social history were all reviewed and documented in the EPIC chart.   Exam:  Vitals:   02/01/23 0858  BP: 120/78  Weight: 155 lb (70.3 kg)  Height: 5\' 2"  (1.575 m)   Body mass index is 28.35 kg/m.  Physical Exam Vitals and nursing note reviewed.  Constitutional:      Appearance: Normal appearance. She is normal weight.  Pulmonary:     Effort: Pulmonary effort is normal.  Neurological:     General: No focal deficit present.     Mental Status: She is alert and oriented to person, place, and time. Mental status is at baseline.  Psychiatric:         Mood and Affect: Mood normal.        Thought Content: Thought content normal.      Assessment/Plan:   1. Premature ovarian insufficiency  - Estradiol - Thyroid Panel With TSH - Prolactin   Discussed treatment options inlcuding HRT vs OCPs or nuva ring. Desires estrogen patches and micronized progesterone. Will send rx once labs are received.  RTO 3 mos for AEX and med f/u  Pharmacy HT on [address].  Arlie Solomons B WHNP-BC 9:19 AM 02/01/2023

## 2023-02-02 LAB — THYROID PANEL WITH TSH
Free Thyroxine Index: 2.4 (ref 1.4–3.8)
T3 Uptake: 30 % (ref 22–35)
T4, Total: 7.9 ug/dL (ref 5.1–11.9)
TSH: 2.33 mIU/L

## 2023-02-02 LAB — ESTRADIOL: Estradiol: 75 pg/mL

## 2023-02-02 LAB — PROLACTIN: Prolactin: 12.8 ng/mL

## 2023-02-15 ENCOUNTER — Encounter: Payer: 59 | Admitting: Physician Assistant

## 2023-03-15 ENCOUNTER — Encounter: Payer: Self-pay | Admitting: Physician Assistant

## 2023-03-15 DIAGNOSIS — Z111 Encounter for screening for respiratory tuberculosis: Secondary | ICD-10-CM

## 2023-03-23 ENCOUNTER — Other Ambulatory Visit (INDEPENDENT_AMBULATORY_CARE_PROVIDER_SITE_OTHER): Payer: 59

## 2023-03-23 DIAGNOSIS — Z111 Encounter for screening for respiratory tuberculosis: Secondary | ICD-10-CM

## 2023-03-25 ENCOUNTER — Other Ambulatory Visit: Payer: Self-pay | Admitting: *Deleted

## 2023-03-25 DIAGNOSIS — Z789 Other specified health status: Secondary | ICD-10-CM

## 2023-05-03 ENCOUNTER — Other Ambulatory Visit (HOSPITAL_COMMUNITY)
Admission: RE | Admit: 2023-05-03 | Discharge: 2023-05-03 | Disposition: A | Discharge status: Home / Self Care | Payer: 59 | Source: Ambulatory Visit | Attending: Radiology | Admitting: Radiology

## 2023-05-03 ENCOUNTER — Encounter: Payer: Self-pay | Admitting: Radiology

## 2023-05-03 ENCOUNTER — Ambulatory Visit (INDEPENDENT_AMBULATORY_CARE_PROVIDER_SITE_OTHER): Payer: 59 | Admitting: Radiology

## 2023-05-03 VITALS — BP 104/78 | Ht 62.0 in | Wt 154.0 lb

## 2023-05-03 DIAGNOSIS — Z01419 Encounter for gynecological examination (general) (routine) without abnormal findings: Secondary | ICD-10-CM | POA: Insufficient documentation

## 2023-05-03 DIAGNOSIS — E2839 Other primary ovarian failure: Secondary | ICD-10-CM

## 2023-05-03 MED ORDER — MEDROXYPROGESTERONE ACETATE 10 MG PO TABS: 10.0000 mg | ORAL_TABLET | Freq: Every day | ORAL | 3 refills | Status: DC

## 2023-05-03 NOTE — Progress Notes (Signed)
   Jody Clark March 14, 1991 [id number]   History:  32 y.o. G0 presents for annual exam. Diagnosed with premature ovarian failure 3months ago and was prescribed HRT. Pt did not start the medication and has since had a period on her own 2 months ago. Open to using provera to induce a period if needed. Did not start the estrogen because her hot flashes stopped.  Gynecologic History Patient's last menstrual period was 02/28/2023 (approximate). Period Duration (Days): 4 Period Pattern: (!) Irregular Menstrual Flow: Moderate Menstrual Control: Thin pad, Maxi pad, Tampon Dysmenorrhea: (!) Mild Dysmenorrhea Symptoms: Cramping Contraception/Family planning:  female partner Sexually active: yes Last Pap: 2019. Results were: normal   Obstetric History OB History  Gravida Para Term Preterm AB Living  0 0 0 0 0 0  SAB IAB Ectopic Multiple Live Births  0 0 0 0 0     The following portions of the patient's history were reviewed and updated as appropriate: allergies, current medications, past family history, past medical history, past social history, past surgical history, and problem list.  Review of Systems Pertinent items noted in HPI and remainder of comprehensive ROS otherwise negative.   Past medical history, past surgical history, family history and social history were all reviewed and documented in the EPIC chart.   Exam:  Vitals:   05/03/23 0804  BP: 104/78  Weight: 154 lb (69.9 kg)  Height: 5\' 2"  (1.575 m)   Body mass index is 28.17 kg/m.  General appearance:  Normal Thyroid:  Symmetrical, normal in size, without palpable masses or nodularity. Respiratory  Auscultation:  Clear without wheezing or rhonchi Cardiovascular  Auscultation:  Regular rate, without rubs, murmurs or gallops  Edema/varicosities:  Not grossly evident Abdominal  Soft,nontender, without masses, guarding or rebound.  Liver/spleen:  No organomegaly noted  Hernia:  None appreciated   Skin  Inspection:  Grossly normal Breasts: Examined lying and sitting.   Right: Without masses, retractions, nipple discharge or axillary adenopathy.   Left: Without masses, retractions, nipple discharge or axillary adenopathy. Genitourinary   Inguinal/mons:  Normal without inguinal adenopathy  External genitalia:  Normal appearing vulva with no masses, tenderness, or lesions  BUS/Urethra/Skene's glands:  Normal without masses or exudate  Vagina:  Normal appearing with normal color and discharge, no lesions  Cervix:  Normal appearing without discharge or lesions  Uterus:  Normal in size, shape and contour.  Mobile, nontender  Adnexa/parametria:     Rt: Normal in size, without masses or tenderness.   Lt: Normal in size, without masses or tenderness.  Anus and perineum: Normal   Raynelle Fanning, CMA present for exam  Assessment/Plan:   1. Well woman exam with routine gynecological exam  - Cytology - PAP( [facility])  2. Premature ovarian insufficiency Reviewed instructions on how/when to take. If she find she has to take it every 3 months or her hot flashes return she is open to looking at starting the HRT. - medroxyPROGESTERone (PROVERA) 10 MG tablet; Take 1 tablet (10 mg total) by mouth daily.  Dispense: 10 tablet; Refill: 3    Discussed SBE, pap and mammogram screening as directed/appropriate. Recommend of exercise weekly, including weight bearing exercise. Encouraged the use of seatbelts and sunscreen. Return in 1 year for annual or as needed.   Arlie Solomons B WHNP-BC 8:10 AM 05/03/2023

## 2023-05-04 ENCOUNTER — Ambulatory Visit: Payer: 59 | Admitting: Radiology

## 2023-05-05 LAB — CYTOLOGY - PAP
Adequacy: ABSENT
Comment: NEGATIVE
Diagnosis: NEGATIVE
High risk HPV: NEGATIVE

## 2023-05-17 DIAGNOSIS — N760 Acute vaginitis: Secondary | ICD-10-CM

## 2023-05-19 MED ORDER — METRONIDAZOLE 500 MG PO TABS
500.0000 mg | ORAL_TABLET | Freq: Two times a day (BID) | ORAL | 0 refills | Status: AC
Start: 2023-05-19 — End: 2023-05-26

## 2023-05-19 NOTE — Telephone Encounter (Signed)
Routing to provider for final review and closing.

## 2023-12-13 ENCOUNTER — Ambulatory Visit
Admission: EM | Admit: 2023-12-13 | Discharge: 2023-12-13 | Disposition: A | Discharge status: Home / Self Care | Attending: Nurse Practitioner | Admitting: Nurse Practitioner

## 2023-12-13 DIAGNOSIS — J069 Acute upper respiratory infection, unspecified: Secondary | ICD-10-CM | POA: Diagnosis not present

## 2023-12-13 DIAGNOSIS — J039 Acute tonsillitis, unspecified: Secondary | ICD-10-CM | POA: Insufficient documentation

## 2023-12-13 DIAGNOSIS — J029 Acute pharyngitis, unspecified: Secondary | ICD-10-CM | POA: Insufficient documentation

## 2023-12-13 DIAGNOSIS — R509 Fever, unspecified: Secondary | ICD-10-CM | POA: Diagnosis not present

## 2023-12-13 LAB — POCT RAPID STREP A (OFFICE): Rapid Strep A Screen: NEGATIVE

## 2023-12-13 LAB — POC COVID19/FLU A&B COMBO
Covid Antigen, POC: NEGATIVE
Influenza A Antigen, POC: NEGATIVE
Influenza B Antigen, POC: NEGATIVE

## 2023-12-13 MED ORDER — IBUPROFEN 800 MG PO TABS
800.0000 mg | ORAL_TABLET | Freq: Once | ORAL | Status: AC
  Administered 2023-12-13: 800 mg via ORAL

## 2023-12-13 MED ORDER — AZITHROMYCIN 500 MG PO TABS: 500.0000 mg | ORAL_TABLET | Freq: Every day | ORAL | 0 refills | Status: AC

## 2023-12-13 MED ORDER — PSEUDOEPH-BROMPHEN-DM 30-2-10 MG/5ML PO SYRP: 10.0000 mL | ORAL_SOLUTION | Freq: Four times a day (QID) | ORAL | 0 refills | Status: DC | PRN

## 2023-12-13 MED ORDER — ACETAMINOPHEN 325 MG PO TABS
650.0000 mg | ORAL_TABLET | Freq: Once | ORAL | Status: AC
  Administered 2023-12-13: 650 mg via ORAL

## 2023-12-13 MED ORDER — PREDNISONE 20 MG PO TABS: 40.0000 mg | ORAL_TABLET | Freq: Every day | ORAL | 0 refills | Status: AC

## 2023-12-13 NOTE — Discharge Instructions (Signed)
 STREP, FLU & COVID NEGATIVE.   Your symptoms are most likely caused by a respiratory infection, which affects areas like your nose, throat, or lungs. This type of infection is usually caused by a virus, but it's also possible that it could be due to a bacterial infection. A throat culture has been collected and is being sent for further testing. This will confirm whether your symptoms are due to a bacterial cause. While waiting on the culture results, I am starting you on antibiotics to begin treatment, and it is important that you take them exactly as prescribed. You have also been given a steroid medication to help reduce inflammation and pain.  To help with throat discomfort, you can sip warm liquids like broth, tea, or warm water. Cold or frozen drinks and snacks, like ice pops, can also soothe your throat. Gargling with warm salt water three to four times a day may provide relief, and sucking on hard candy or throat lozenges can help as well. Using a cool-mist humidifier at night can keep the air moist and make breathing easier. Tylenol or ibuprofen can be taken if you have any pain or fever.  Be sure to drink plenty of fluids to stay well hydrated. Once you have finished your medication and your symptoms have improved, remember to replace your toothbrush to help prevent re-infection. If your symptoms get worse or do not improve within a few days, follow up with your healthcare provider.

## 2023-12-13 NOTE — ED Triage Notes (Signed)
 Pt presents with a fever x 2 days, bilateral earaches, sore throat. Pt states she took tylenol around 8pm last night.

## 2023-12-13 NOTE — ED Provider Notes (Signed)
 [facility]    CSN: [id number] Arrival date & time: 12/13/23  0805      History   Chief Complaint Chief Complaint  Patient presents with   Fever   Cough   Sore Throat    HPI Liliani Bobo is a 33 y.o. female.     Subjective:   Ruhee Enck is a 33 y.o. female who presents for evaluation of a sore throat. Symptoms began 2 days ago and have gradually worsened. She initially developed a cough 4-5 days ago, which she attributed to allergies. Two days ago, she developed a sore throat along with fevers (Tmax 102.7), bilateral ear pain, chills, body aches, runny nose, nasal congestion, and nausea. She denies shortness of breath, wheezing, vomiting, diarrhea, or headache. She is staying hydrated and took tylenol for his symptoms. She reports no known recent close exposure to confirmed streptococcal pharyngitis, though she works as a Engineer, civil (consulting) at [employer].  The following portions of the patient's history were reviewed and updated as appropriate: allergies, current medications, past family history, past medical history, past social history, past surgical history, and problem list.           Past Medical History:  Diagnosis Date   Acid reflux    Premature ovarian failure     Patient Active Problem List   Diagnosis Date Noted   Overweight 09/09/2021    Past Surgical History:  Procedure Laterality Date   APPENDECTOMY  10/05/2016   lap appy   LAPAROSCOPIC APPENDECTOMY N/A 10/05/2016   Procedure: APPENDECTOMY LAPAROSCOPIC;  Surgeon: Harriette Bouillon, MD;  Location: [facility] OR;  Service: General;  Laterality: N/A;    OB History     Gravida  0   Para  0   Term  0   Preterm  0   AB  0   Living  0      SAB  0   IAB  0   Ectopic  0   Multiple  0   Live Births  0            Home Medications    Prior to Admission medications   Medication Sig Start Date End Date Taking? Authorizing Provider  azithromycin (ZITHROMAX) 500 MG  tablet Take 1 tablet (500 mg total) by mouth daily for 5 days. 12/13/23 12/18/23 Yes Lurline Idol, FNP  brompheniramine-pseudoephedrine-DM 30-2-10 MG/5ML syrup Take 10 mLs by mouth every 6 (six) hours as needed (cough and congestion). 12/13/23  Yes Lurline Idol, FNP  predniSONE (DELTASONE) 20 MG tablet Take 2 tablets (40 mg total) by mouth daily for 5 days. 12/13/23 12/18/23 Yes Lurline Idol, FNP  medroxyPROGESTERone (PROVERA) 10 MG tablet Take 1 tablet (10 mg total) by mouth daily. 05/03/23   Chrzanowski, Lamona Curl, NP    Family History Family History  Problem Relation Age of Onset   Hypertension Father    Early menopause Maternal Aunt        30's   Prostate cancer Neg Hx    Breast cancer Neg Hx    Diabetes Neg Hx     Social History Social History   Tobacco Use   Smoking status: Some Days    Types: Cigars    Passive exposure: Never   Smokeless tobacco: Never  Vaping Use   Vaping status: Some Days  Substance Use Topics   Alcohol use: Yes    Alcohol/week: 1.0 standard drink of alcohol    Types: 1 Cans of beer per week  Drug use: No     Allergies   Patient has no known allergies.   Review of Systems Review of Systems  Constitutional:  Positive for chills and fever.  HENT:  Positive for congestion, ear pain, rhinorrhea, sore throat and trouble swallowing. Negative for voice change.   Respiratory:  Positive for cough. Negative for shortness of breath and wheezing.   Gastrointestinal:  Positive for nausea. Negative for diarrhea and vomiting.  Musculoskeletal:  Positive for myalgias.  Neurological:  Negative for headaches.  All other systems reviewed and are negative.    Physical Exam Triage Vital Signs ED Triage Vitals  Encounter Vitals Group     BP 12/13/23 0816 135/86     Systolic BP Percentile --      Diastolic BP Percentile --      Pulse Rate 12/13/23 0816 (!) 132     Resp 12/13/23 0816 16     Temp 12/13/23 0816 (!) 101.7 F (38.7 C)     Temp Source  12/13/23 0816 Oral     SpO2 12/13/23 0816 95 %     Weight --      Height --      Head Circumference --      Peak Flow --      Pain Score 12/13/23 0814 7     Pain Loc --      Pain Education --      Exclude from Growth Chart --    No data found.  Updated Vital Signs BP 135/86 (BP Location: Right Arm)   Pulse (!) 113   Temp (!) 101 F (38.3 C) (Oral)   Resp 16   LMP 11/13/2023 (Exact Date)   SpO2 95%   Visual Acuity Right Eye Distance:   Left Eye Distance:   Bilateral Distance:    Right Eye Near:   Left Eye Near:    Bilateral Near:     Physical Exam Vitals reviewed.  Constitutional:      General: She is awake. She is not in acute distress.    Appearance: Normal appearance. She is well-developed and normal weight. She is ill-appearing. She is not toxic-appearing or diaphoretic.  HENT:     Head: Normocephalic.     Right Ear: Tympanic membrane and ear canal normal. No drainage, swelling or tenderness. No middle ear effusion. Tympanic membrane is not erythematous.     Left Ear: Tympanic membrane and ear canal normal. No drainage, swelling or tenderness.  No middle ear effusion. Tympanic membrane is not erythematous.     Nose: Congestion present.     Mouth/Throat:     Lips: Pink.     Mouth: Mucous membranes are moist.     Pharynx: Uvula midline. Pharyngeal swelling, oropharyngeal exudate and posterior oropharyngeal erythema present. No uvula swelling.     Tonsils: No tonsillar exudate or tonsillar abscesses. 2+ on the right. 2+ on the left.  Eyes:     Conjunctiva/sclera: Conjunctivae normal.  Cardiovascular:     Rate and Rhythm: Normal rate.     Heart sounds: Normal heart sounds.  Pulmonary:     Effort: Pulmonary effort is normal. No tachypnea.     Breath sounds: Normal breath sounds and air entry. No decreased air movement. No decreased breath sounds.  Abdominal:     Palpations: Abdomen is soft.  Musculoskeletal:        General: Normal range of motion.     Cervical  back: Full passive range of motion without pain, normal range of  motion and neck supple.  Lymphadenopathy:     Cervical: Cervical adenopathy (tender) present.  Skin:    General: Skin is warm and dry.  Neurological:     General: No focal deficit present.     Mental Status: She is alert and oriented to person, place, and time.  Psychiatric:        Mood and Affect: Mood normal.        Behavior: Behavior normal. Behavior is cooperative.      UC Treatments / Results  Labs (all labs ordered are listed, but only abnormal results are displayed) Labs Reviewed  CULTURE, GROUP A STREP [facility])  POCT RAPID STREP A (OFFICE)  POC COVID19/FLU A&B COMBO    EKG   Radiology No results found.  Procedures Procedures (including critical care time)  Medications Ordered in UC Medications  acetaminophen (TYLENOL) tablet 650 mg (650 mg Oral Given 12/13/23 0820)    Initial Impression / Assessment and Plan / UC Course  I have reviewed the triage vital signs and the nursing notes.  Pertinent labs & imaging results that were available during my care of the patient were reviewed by me and considered in my medical decision making (see chart for details).    33 year old female presents with worsening sore throat, cough, fever, bilateral ear pain, chills, body aches, runny nose, nasal congestion, and nausea. She is febrile at 101.74F and appears acutely ill, though not in respiratory distress. Physical exam is notable for pharyngeal swelling, oropharyngeal exudates, and posterior oropharyngeal erythema. Rapid strep, COVID, and influenza tests are negative. Tylenol and Motrin were administered in clinic. A throat culture has been sent for further evaluation. Given symptom severity and physical findings, Zithromax, prednisone, and Bromfed-DM were prescribed. Supportive care measures and return precautions were reviewed thoroughly with the patient.  Today's evaluation has revealed no signs of a dangerous  process. Discussed diagnosis with patient and/or guardian. Patient and/or guardian aware of their diagnosis, possible red flag symptoms to watch out for and need for close follow up. Patient and/or guardian understands verbal and written discharge instructions. Patient and/or guardian comfortable with plan and disposition.  Patient and/or guardian has a clear mental status at this time, good insight into illness (after discussion and teaching) and has clear judgment to make decisions regarding their care  Documentation was completed with the aid of voice recognition software. Transcription may contain typographical errors. Final Clinical Impressions(s) / UC Diagnoses   Final diagnoses:  Fever, unspecified fever cause  Sore throat  Acute tonsillitis, unspecified etiology  Upper respiratory tract infection, unspecified type     Discharge Instructions      STREP, FLU & COVID NEGATIVE.   Your symptoms are most likely caused by a respiratory infection, which affects areas like your nose, throat, or lungs. This type of infection is usually caused by a virus, but it's also possible that it could be due to a bacterial infection. A throat culture has been collected and is being sent for further testing. This will confirm whether your symptoms are due to a bacterial cause. While waiting on the culture results, I am starting you on antibiotics to begin treatment, and it is important that you take them exactly as prescribed. You have also been given a steroid medication to help reduce inflammation and pain.  To help with throat discomfort, you can sip warm liquids like broth, tea, or warm water. Cold or frozen drinks and snacks, like ice pops, can also soothe your throat. Gargling with warm  salt water three to four times a day may provide relief, and sucking on hard candy or throat lozenges can help as well. Using a cool-mist humidifier at night can keep the air moist and make breathing easier. Tylenol or  ibuprofen can be taken if you have any pain or fever.  Be sure to drink plenty of fluids to stay well hydrated. Once you have finished your medication and your symptoms have improved, remember to replace your toothbrush to help prevent re-infection. If your symptoms get worse or do not improve within a few days, follow up with your healthcare provider.      ED Prescriptions     Medication Sig Dispense Auth. Provider   azithromycin (ZITHROMAX) 500 MG tablet Take 1 tablet (500 mg total) by mouth daily for 5 days. 5 tablet Maryruth Sol, FNP   predniSONE (DELTASONE) 20 MG tablet Take 2 tablets (40 mg total) by mouth daily for 5 days. 10 tablet Maryruth Sol, FNP   brompheniramine-pseudoephedrine-DM 30-2-10 MG/5ML syrup Take 10 mLs by mouth every 6 (six) hours as needed (cough and congestion). 120 mL Maryruth Sol, FNP      PDMP not reviewed this encounter.   Maryruth Sol, [place] 12/13/23 1012

## 2023-12-16 LAB — CULTURE, GROUP A STREP (THRC)

## 2024-09-20 ENCOUNTER — Ambulatory Visit: Payer: Self-pay | Admitting: Physician Assistant

## 2024-09-20 ENCOUNTER — Encounter: Payer: Self-pay | Admitting: Physician Assistant

## 2024-09-20 ENCOUNTER — Ambulatory Visit: Admitting: Physician Assistant

## 2024-09-20 VITALS — BP 110/80 | HR 72 | Temp 98.1°F | Ht 62.0 in | Wt 151.8 lb

## 2024-09-20 DIAGNOSIS — Z6827 Body mass index (BMI) 27.0-27.9, adult: Secondary | ICD-10-CM

## 2024-09-20 DIAGNOSIS — E559 Vitamin D deficiency, unspecified: Secondary | ICD-10-CM

## 2024-09-20 DIAGNOSIS — N926 Irregular menstruation, unspecified: Secondary | ICD-10-CM | POA: Diagnosis not present

## 2024-09-20 DIAGNOSIS — Z0001 Encounter for general adult medical examination with abnormal findings: Secondary | ICD-10-CM

## 2024-09-20 DIAGNOSIS — E663 Overweight: Secondary | ICD-10-CM | POA: Diagnosis not present

## 2024-09-20 DIAGNOSIS — Z0000 Encounter for general adult medical examination without abnormal findings: Secondary | ICD-10-CM

## 2024-09-20 DIAGNOSIS — Z13220 Encounter for screening for lipoid disorders: Secondary | ICD-10-CM | POA: Diagnosis not present

## 2024-09-20 LAB — CBC WITH DIFFERENTIAL/PLATELET
Basophils Absolute: 0 K/uL (ref 0.0–0.1)
Basophils Relative: 0.9 % (ref 0.0–3.0)
Eosinophils Absolute: 0.2 K/uL (ref 0.0–0.7)
Eosinophils Relative: 3.5 % (ref 0.0–5.0)
HCT: 40.9 % (ref 36.0–46.0)
Hemoglobin: 14.1 g/dL (ref 12.0–15.0)
Lymphocytes Relative: 47.1 % — ABNORMAL HIGH (ref 12.0–46.0)
Lymphs Abs: 2.6 K/uL (ref 0.7–4.0)
MCHC: 34.4 g/dL (ref 30.0–36.0)
MCV: 88.6 fl (ref 78.0–100.0)
Monocytes Absolute: 0.5 K/uL (ref 0.1–1.0)
Monocytes Relative: 9.3 % (ref 3.0–12.0)
Neutro Abs: 2.1 K/uL (ref 1.4–7.7)
Neutrophils Relative %: 39.2 % — ABNORMAL LOW (ref 43.0–77.0)
Platelets: 261 K/uL (ref 150.0–400.0)
RBC: 4.62 Mil/uL (ref 3.87–5.11)
RDW: 13.3 % (ref 11.5–15.5)
WBC: 5.5 K/uL (ref 4.0–10.5)

## 2024-09-20 LAB — COMPREHENSIVE METABOLIC PANEL WITH GFR
ALT: 19 U/L (ref 3–35)
AST: 22 U/L (ref 5–37)
Albumin: 4.5 g/dL (ref 3.5–5.2)
Alkaline Phosphatase: 86 U/L (ref 39–117)
BUN: 14 mg/dL (ref 6–23)
CO2: 28 meq/L (ref 19–32)
Calcium: 9.5 mg/dL (ref 8.4–10.5)
Chloride: 105 meq/L (ref 96–112)
Creatinine, Ser: 0.98 mg/dL (ref 0.40–1.20)
GFR: 76.19 mL/min
Glucose, Bld: 92 mg/dL (ref 70–99)
Potassium: 4.1 meq/L (ref 3.5–5.1)
Sodium: 139 meq/L (ref 135–145)
Total Bilirubin: 0.5 mg/dL (ref 0.2–1.2)
Total Protein: 7.1 g/dL (ref 6.0–8.3)

## 2024-09-20 LAB — LIPID PANEL
Cholesterol: 172 mg/dL (ref 28–200)
HDL: 61.3 mg/dL
LDL Cholesterol: 100 mg/dL — ABNORMAL HIGH (ref 10–99)
NonHDL: 110.23
Total CHOL/HDL Ratio: 3
Triglycerides: 49 mg/dL (ref 10.0–149.0)
VLDL: 9.8 mg/dL (ref 0.0–40.0)

## 2024-09-20 LAB — VITAMIN D 25 HYDROXY (VIT D DEFICIENCY, FRACTURES): VITD: 18.26 ng/mL — ABNORMAL LOW (ref 30.00–100.00)

## 2024-09-20 MED ORDER — VITAMIN D (ERGOCALCIFEROL) 1.25 MG (50000 UNIT) PO CAPS: 50000.0000 [IU] | ORAL_CAPSULE | ORAL | 0 refills | Status: AC

## 2024-09-20 NOTE — Progress Notes (Signed)
 "  Subjective:    Jody Clark is a 34 y.o. female and is here for a comprehensive physical exam.  HPI  There are no preventive care reminders to display for this patient.  Discussed the use of AI scribe software for clinical note transcription with the patient, who gave verbal consent to proceed.  History of Present Illness   Jody Clark is a 34 year old female who presents with irregular menstrual cycles and concerns about vitamin deficiency.  She has irregular cycles with episodes of amenorrhea lasting up to 1.5 months, followed by return to regular menses. She had a single hot flash. She declined medication to induce menses and her period started the next day without treatment. She is not using an app to track cycles.  Her recent Pap smear was normal but showed bacterial vaginitis, which was treated and completed. She had hormone testing and was told the results were better than expected.  She is concerned about possible vitamin D  deficiency due to new hypopigmentation around her mouth first noted a few weeks ago. She takes a daily multivitamin gummy and has been monitoring the area.  She has occasional headaches 1 to 2 days before menses, which she associates with menstrual migraines.  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 She lives with her partner. She drinks alcohol about once per week and smokes cigars about once per week. She has increased exercise with CrossFit 3 to 4 times per week and improved her diet by reducing sodium and soda and choosing sparkling water.      Health Maintenance: Immunizations -- UpToDate  Colonoscopy -- N/A  Mammogram -- N/A  PAP -- UpToDate  Bone Density -- N/A  Diet -- lean proteins Exercise -- started crossfit  Sleep habits -- overall doing well, works 3rd shift Mood -- stable  UTD with dentist? - N/A  UTD with eye doctor? - overdue  Weight  history: Wt Readings from Last 10 Encounters:  09/20/24 151 lb 12.8 oz (68.9 kg)  05/03/23 154 lb (69.9 kg)  02/01/23 155 lb (70.3 kg)  01/25/23 156 lb 4 oz (70.9 kg)  09/09/21 144 lb 8 oz (65.5 kg)  02/06/18 132 lb 12.8 oz (60.2 kg)  12/17/17 131 lb 9.6 oz (59.7 kg)  12/07/17 132 lb (59.9 kg)  10/04/16 130 lb (59 kg)  05/04/13 110 lb (49.9 kg)   Body mass index is 27.76 kg/m. Patient's last menstrual period was 09/07/2024.  Alcohol use:  reports current alcohol use of about 1.0 standard drink of alcohol per week.  Tobacco use:  Tobacco Use: High Risk (09/20/2024)   Patient History    Smoking Tobacco Use: Some Days    Smokeless Tobacco Use: Never    Passive Exposure: Never   Eligible for lung cancer screening? no     01/25/2023   11:07 AM  Depression screen PHQ 2/9  Decreased Interest 0  Down, Depressed, Hopeless 0  PHQ - 2 Score 0     Other providers/specialists: Patient Care Team: Job Lukes, [place] as PCP - General (Physician Assistant) Ginette Shasta NOVAK, NP as Nurse Practitioner (Radiology)    PMHx, SurgHx, SocialHx, Medications, and Allergies were reviewed in the Visit Navigator and updated as appropriate.   Past Medical History:  Diagnosis Date   Acid reflux    Premature ovarian failure      Past Surgical History:  Procedure Laterality Date  APPENDECTOMY  10/05/2016   lap appy   LAPAROSCOPIC APPENDECTOMY N/A 10/05/2016   Procedure: APPENDECTOMY LAPAROSCOPIC;  Surgeon: Debby Shipper, MD;  Location: [facility] OR;  Service: General;  Laterality: N/A;     Family History  Problem Relation Age of Onset   Hypertension Father    Early menopause Maternal Aunt        30's   Prostate cancer Neg Hx    Breast cancer Neg Hx    Diabetes Neg Hx     Social History[1]  Review of Systems:   Review of Systems  Constitutional:  Negative for chills, fever, malaise/fatigue and weight loss.  HENT:  Negative for hearing loss, sinus pain and sore throat.    Respiratory:  Negative for cough and hemoptysis.   Cardiovascular:  Negative for chest pain, palpitations, leg swelling and PND.  Gastrointestinal:  Negative for abdominal pain, constipation, diarrhea, heartburn, nausea and vomiting.  Genitourinary:  Negative for dysuria, frequency and urgency.  Musculoskeletal:  Negative for back pain, myalgias and neck pain.  Skin:  Negative for itching and rash.  Neurological:  Negative for dizziness, tingling, seizures and headaches.  Endo/Heme/Allergies:  Negative for polydipsia.  Psychiatric/Behavioral:  Negative for depression. The patient is not nervous/anxious.     Objective:   BP 110/80 (BP Location: Right Arm, Patient Position: Sitting, Cuff Size: Normal)   Pulse 72   Temp 98.1 F (36.7 C) (Temporal)   Ht 5' 2 (1.575 m)   Wt 151 lb 12.8 oz (68.9 kg)   LMP 09/07/2024   SpO2 98%   BMI 27.76 kg/m  Body mass index is 27.76 kg/m.   General Appearance:    Alert, cooperative, no distress, appears stated age  Head:    Normocephalic, without obvious abnormality, atraumatic  Eyes:    PERRL, conjunctiva/corneas clear, EOM's intact, fundi    benign, both eyes  Ears:    Normal TM's and external ear canals, both ears  Nose:   Nares normal, septum midline, mucosa normal, no drainage    or sinus tenderness  Throat:   Lips, mucosa, and tongue normal; teeth and gums normal  Neck:   Supple, symmetrical, trachea midline, no adenopathy;    thyroid :  no enlargement/tenderness/nodules; no carotid   bruit or JVD  Back:     Symmetric, no curvature, ROM normal, no CVA tenderness  Lungs:     Clear to auscultation bilaterally, respirations unlabored  Chest Wall:    No tenderness or deformity   Heart:    Regular rate and rhythm, S1 and S2 normal, no murmur, rub or gallop  Breast Exam:    Deferred  Abdomen:     Soft, non-tender, bowel sounds active all four quadrants,    no masses, no organomegaly  Genitalia:    Deferred   Extremities:   Extremities  normal, atraumatic, no cyanosis or edema  Pulses:   2+ and symmetric all extremities  Skin:   Skin color, texture, turgor normal, no rashes or lesions  Lymph nodes:   Cervical, supraclavicular, and axillary nodes normal  Neurologic:   CNII-XII intact, normal strength, sensation and reflexes    throughout    Assessment/Plan:   Assessment and Plan    Comprehensive Physical Exam (CPE) preventive care annual visit Today patient counseled on age appropriate routine health concerns for screening and prevention, each reviewed and up to date or declined. Immunizations reviewed and up to date or declined. Labs ordered and reviewed. Risk factors for depression reviewed and negative. Hearing function  and visual acuity are intact. ADLs screened and addressed as needed. Functional ability and level of safety reviewed and appropriate. Education, counseling and referrals performed based on assessed risks today. Patient provided with a copy of personalized plan for preventive services.  Irregular menstruation Intermittent irregular menstruation with amenorrhea up to 1.5 months. Normal hormone tests. Declined medication. Normal Pap smear. - Consider logging menstrual cycles in an app.  Vitamin D  deficiency Possible vitamin D  deficiency due to hyperpigmentation around lips. Taking multivitamin gummy. - Ordered blood work to assess vitamin D  levels.  Overweight Previously overweight, now managing weight with diet and exercise. Engaged in CrossFit 3-4 times weekly. Diet includes lean proteins and vegetables. Reports improved appearance and reduced fluid retention. - Continue current exercise regimen and dietary modifications.        Lucie Buttner, PA-C [facility]           [1]  Social History Tobacco Use   Smoking status: Some Days    Types: Cigars    Passive exposure: Never   Smokeless tobacco: Never  Vaping Use   Vaping status: Some Days  Substance Use Topics   Alcohol  use: Yes    Alcohol/week: 1.0 standard drink of alcohol    Types: 1 Cans of beer per week   Drug use: Never   "

## 2024-09-20 NOTE — Patient Instructions (Addendum)
 SABRA

## 2024-09-21 ENCOUNTER — Encounter: Admitting: Physician Assistant

## 2024-10-30 ENCOUNTER — Encounter: Admitting: Physician Assistant

## 2024-10-31 ENCOUNTER — Encounter: Admitting: Physician Assistant

## 2025-09-23 ENCOUNTER — Encounter: Admitting: Physician Assistant
